# Patient Record
Sex: Female | Born: 1945
Health system: Southern US, Community
[De-identification: ages and names within clinical notes are randomized; demographics above are authoritative.]

## PROBLEM LIST (undated history)

## (undated) DIAGNOSIS — I10 Essential (primary) hypertension: Secondary | ICD-10-CM

## (undated) DIAGNOSIS — K219 Gastro-esophageal reflux disease without esophagitis: Secondary | ICD-10-CM

## (undated) DIAGNOSIS — R011 Cardiac murmur, unspecified: Secondary | ICD-10-CM

## (undated) DIAGNOSIS — M1990 Unspecified osteoarthritis, unspecified site: Secondary | ICD-10-CM

## (undated) DIAGNOSIS — G2581 Restless legs syndrome: Secondary | ICD-10-CM

## (undated) DIAGNOSIS — K5790 Diverticulosis of intestine, part unspecified, without perforation or abscess without bleeding: Secondary | ICD-10-CM

## (undated) DIAGNOSIS — R32 Unspecified urinary incontinence: Secondary | ICD-10-CM

## (undated) DIAGNOSIS — T4145XA Adverse effect of unspecified anesthetic, initial encounter: Secondary | ICD-10-CM

## (undated) HISTORY — PX: TUBAL LIGATION: SHX77

---

## 2004-06-28 DIAGNOSIS — T8859XA Other complications of anesthesia, initial encounter: Secondary | ICD-10-CM

## 2004-06-28 HISTORY — DX: Other complications of anesthesia, initial encounter: T88.59XA

## 2004-06-28 HISTORY — PX: CARDIAC CATHETERIZATION: SHX172

## 2004-10-28 HISTORY — PX: HERNIA REPAIR: SHX51

## 2004-10-28 HISTORY — PX: CHOLECYSTECTOMY: SHX55

## 2004-11-26 HISTORY — PX: OTHER SURGICAL HISTORY: SHX169

## 2011-05-12 HISTORY — PX: OTHER SURGICAL HISTORY: SHX169

## 2012-11-21 ENCOUNTER — Encounter (HOSPITAL_COMMUNITY): Payer: Self-pay | Admitting: Pharmacy Technician

## 2012-11-21 ENCOUNTER — Other Ambulatory Visit: Payer: Self-pay | Admitting: Orthopedic Surgery

## 2012-11-21 MED ORDER — BUPIVACAINE LIPOSOME 1.3 % IJ SUSP: 20.0000 mL | Freq: Once | INTRAMUSCULAR | Status: DC

## 2012-11-21 MED ORDER — DEXAMETHASONE SODIUM PHOSPHATE 10 MG/ML IJ SOLN: 10.0000 mg | Freq: Once | INTRAMUSCULAR | Status: DC

## 2012-11-21 NOTE — Progress Notes (Signed)
Preoperative surgical orders have been place into the Epic hospital system for [facility] on 11/21/2012, 7:47 AM  by Patrica Duel for surgery on 12/04/2012.  Preop Total Knee orders including Experal, IV Tylenol, and IV Decadron as long as there are no contraindications to the above medications. Avel Peace, PA-C .

## 2012-11-27 ENCOUNTER — Other Ambulatory Visit (HOSPITAL_COMMUNITY): Payer: Self-pay | Admitting: Orthopedic Surgery

## 2012-11-27 NOTE — Patient Instructions (Addendum)
20 Calvina Liptak  11/27/2012   Your procedure is scheduled on: 12-04-2012  Report to [facility] at 835 AM.  Call this number if you have problems the morning of surgery [phone]   Remember:   Do not eat food or drink liquids :After Midnight.     Take these medicines the morning of surgery with A SIP OF WATER: xanax if needed, lansprazole                                SEE [place] PREPARING FOR SURGERY SHEET   Do not wear jewelry, make-up or nail polish.  Do not wear lotions, powders, or perfumes. You may wear deodorant.   Men may shave face and neck.  Do not bring valuables to the hospital.  Contacts, dentures or bridgework may not be worn into surgery.  Leave suitcase in the car. After surgery it may be brought to your room.  For patients admitted to the hospital, checkout time is 11:00 AM the day of discharge.   Patients discharged the day of surgery will not be allowed to drive home.  Name and phone number of your driver:  Special Instructions: N/A   Please read over the following fact sheets that you were given: MRSA Information., blood fact sheet, incentive spirometer fact sheet Call Cain Sieve RN pre op nurse if needed [phone]    FAILURE TO FOLLOW THESE INSTRUCTIONS MAY RESULT IN THE CANCELLATION OF YOUR SURGERY. PATIENT SIGNATURE___________________________________________

## 2012-11-28 ENCOUNTER — Other Ambulatory Visit: Payer: Self-pay | Admitting: Orthopedic Surgery

## 2012-11-28 ENCOUNTER — Ambulatory Visit (HOSPITAL_COMMUNITY)
Admission: RE | Admit: 2012-11-28 | Discharge: 2012-11-28 | Disposition: A | Discharge status: Home / Self Care | Payer: Medicare Other | Source: Ambulatory Visit | Attending: Orthopedic Surgery | Admitting: Orthopedic Surgery

## 2012-11-28 ENCOUNTER — Encounter (HOSPITAL_COMMUNITY)
Admission: RE | Admit: 2012-11-28 | Discharge: 2012-11-28 | Disposition: A | Discharge status: Home / Self Care | Payer: Medicare Other | Source: Ambulatory Visit | Attending: Orthopedic Surgery | Admitting: Orthopedic Surgery

## 2012-11-28 ENCOUNTER — Encounter (HOSPITAL_COMMUNITY): Payer: Self-pay

## 2012-11-28 DIAGNOSIS — Z01810 Encounter for preprocedural cardiovascular examination: Secondary | ICD-10-CM | POA: Insufficient documentation

## 2012-11-28 DIAGNOSIS — Z01818 Encounter for other preprocedural examination: Secondary | ICD-10-CM | POA: Insufficient documentation

## 2012-11-28 DIAGNOSIS — I10 Essential (primary) hypertension: Secondary | ICD-10-CM | POA: Insufficient documentation

## 2012-11-28 DIAGNOSIS — Z01812 Encounter for preprocedural laboratory examination: Secondary | ICD-10-CM | POA: Insufficient documentation

## 2012-11-28 HISTORY — DX: Diverticulosis of intestine, part unspecified, without perforation or abscess without bleeding: K57.90

## 2012-11-28 HISTORY — DX: Unspecified osteoarthritis, unspecified site: M19.90

## 2012-11-28 HISTORY — DX: Restless legs syndrome: G25.81

## 2012-11-28 HISTORY — DX: Gastro-esophageal reflux disease without esophagitis: K21.9

## 2012-11-28 HISTORY — DX: Adverse effect of unspecified anesthetic, initial encounter: T41.45XA

## 2012-11-28 HISTORY — DX: Unspecified urinary incontinence: R32

## 2012-11-28 HISTORY — DX: Essential (primary) hypertension: I10

## 2012-11-28 LAB — URINALYSIS, ROUTINE W REFLEX MICROSCOPIC
Glucose, UA: NEGATIVE mg/dL
Leukocytes, UA: NEGATIVE
Protein, ur: NEGATIVE mg/dL
Urobilinogen, UA: 0.2 mg/dL (ref 0.0–1.0)

## 2012-11-28 LAB — CBC
MCH: 32.3 pg (ref 26.0–34.0)
Platelets: 241 10*3/uL (ref 150–400)
RBC: 4.03 MIL/uL (ref 3.87–5.11)
WBC: 9 10*3/uL (ref 4.0–10.5)

## 2012-11-28 LAB — APTT: aPTT: 32 seconds (ref 24–37)

## 2012-11-28 LAB — COMPREHENSIVE METABOLIC PANEL
ALT: 30 U/L (ref 0–35)
AST: 22 U/L (ref 0–37)
CO2: 30 mEq/L (ref 19–32)
Calcium: 9.8 mg/dL (ref 8.4–10.5)
GFR calc non Af Amer: 72 mL/min — ABNORMAL LOW (ref >90)
Potassium: 3.5 mEq/L (ref 3.5–5.1)
Sodium: 141 mEq/L (ref 135–145)

## 2012-11-28 LAB — SURGICAL PCR SCREEN: MRSA, PCR: NEGATIVE

## 2012-11-28 NOTE — Progress Notes (Signed)
11/28/12 1308  OBSTRUCTIVE SLEEP APNEA  Have you ever been diagnosed with sleep apnea through a sleep study? No  Do you snore loudly (loud enough to be heard through closed doors)?  1  Do you often feel tired, fatigued, or sleepy during the daytime? 0  Has anyone observed you stop breathing during your sleep? 0  Do you have, or are you being treated for high blood pressure? 1  BMI more than 35 kg/m2? 1  Age over 66 years old? 1  Neck circumference greater than 40 cm/18 inches? 0  Gender: 0  Obstructive Sleep Apnea Score 4  Score 4 or greater  Results sent to PCP

## 2012-12-03 ENCOUNTER — Other Ambulatory Visit: Payer: Self-pay | Admitting: Orthopedic Surgery

## 2012-12-03 NOTE — H&P (Signed)
Alyssa Campbell  DOB: Jan 08, 1946 Married / Language: English / Race: White Female  Date of Admission:  12/04/2012  Chief Complaint:  Right Knee Pain  History of Present Illness  The patient is a 67 year old female who comes in for a preoperative History and Physical. The patient is scheduled for a right total knee arthroplasty to be performed by Dr. Gus Rankin. Aluisio, MD at [facility] on 12/04/2012. The patient is a 67 year old female who presents with knee complaints. The patient is seen today in referral from three of her neighbors. The patient reports right knee symptoms including: pain which began 1 year(s) (or so) ago. Note for "Knee pain": She states she fell in college and she said the knee "went out of place". She said she had off and on problems initially, but then the knee did not start flaring again until she was doing "push offs" in her water exercise program about a year ago. She has had long term problems with her right knee. She had both hips replaced in [place] and has done well with those. The right knee has gotten progressively worse over the past year or two. She states that it is hurting her at all times. This is limiting what she can and can not do. She has had an injection in the past without much benefit. She is at a stage now where she wants some more permanent solution to this knee. She is ready to proceed with surgery. They have been treated conservatively in the past for the above stated problem and despite conservative measures, they continue to have progressive pain and severe functional limitations and dysfunction. They have failed non-operative management including home exercise, medications, and injections. It is felt that they would benefit from undergoing total joint replacement. Risks and benefits of the procedure have been discussed with the patient and they elect to proceed with surgery. There are no active contraindications to surgery such as  ongoing infection or rapidly progressive neurological disease.   Problem List Primary osteoarthritis of one knee (715.16)   Allergies  Erythromycin *MACROLIDES* Band-Aid *MEDICAL DEVICES*. Adhesive - NOT LATEX ALLERGY   Family History  Cancer. father Chronic Obstructive Lung Disease. mother Osteoarthritis. grandmother mothers side Severe allergy. mother   Social History Marital status. married Most recent primary occupation. Retired Programmer, systems, current Education officer, museum Number of flights of stairs before winded. 1 Living situation. live with spouse Drug/Alcohol Rehab (Currently). no Exercise. Exercises rarely Illicit drug use. no Pain Contract. no Previously in rehab. no Tobacco / smoke exposure. no Tobacco use. never smoker Alcohol use. current drinker; drinks hard liquor; only occasionally per week Children. 0 Current work status. 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 Living Will, Healthcare POA   Medication History Benazepril-Hydrochlorothiazide (10-12.5MG  Tablet, Oral) Active. CeleBREX (200MG  Capsule, Oral) Active. Myrbetriq (50MG  Tablet ER 24HR, Oral) Active. Remeron (15MG  Tablet, Oral) Active. Mirapex (0.25MG  Tablet, Oral) Active. Lansoprazole (30MG  Capsule DR, Oral) Active. Aspirin EC (81MG  Tablet DR, Oral) Active. Xanax (0.5MG  Tablet, Oral) Active. Fish Oil Burp-Less ( Oral) Specific dose unknown - Active. Cranberry ( Oral) Specific dose unknown - Active. Vitamin D3 ( Oral) Specific dose unknown - Active. Vitamin B-12 ( Oral) Specific dose unknown - Active. Multivitamins ( Oral) Active. Calcium Citrate ( Oral) Specific dose unknown - Active. Fiber ( Oral) Active. Probiotic ( Oral) Active.    Past Surgical History  Tubal Ligation Gallbladder Surgery. laporoscopic; Umbilical Hernia Repair Total Hip Replacement. bilateral Cardiac Cath. Date: 2006.  Medical History High blood  pressure Hypercholesterolemia Osteoarthritis Restless Leg Syndrome Diverticulosis Urinary Incontinence Overactive Bladder Gastroesophageal Reflux Disease   Review of Systems  General:Not Present- Chills, Fever, Night Sweats, Appetite Loss, Fatigue, Feeling sick, Weight Gain and Weight Loss. Skin:Not Present- Itching, Rash, Skin Color Changes, Ulcer, Psoriasis and Change in Hair or Nails. HEENT:Not Present- Sensitivity to light, Nose Bleed, Visual Loss, Decreased Hearing and Ringing in the Ears. Neck:Not Present- Swollen Glands and Neck Mass. Respiratory:Not Present- Shortness of breath, Snoring, Chronic Cough and Bloody sputum. Cardiovascular:Not Present- Shortness of Breath, Chest Pain, Swelling of Extremities, Leg Cramps and Palpitations. Gastrointestinal:Present- Heartburn. Not Present- Bloody Stool, Abdominal Pain, Vomiting, Nausea and Incontinence of Stool. Female Genitourinary:Not Present- Blood in Urine, Irregular/missing periods, Frequency, Incontinence and Nocturia. Musculoskeletal:Present- Joint Stiffness and Joint Pain. Not Present- Muscle Weakness, Muscle Pain, Joint Swelling and Back Pain. Neurological:Not Present- Tingling, Numbness, Burning, Tremor, Headaches and Dizziness. Psychiatric:Not Present- Anxiety, Depression and Memory Loss. Endocrine:Not Present- Cold Intolerance, Heat Intolerance, Excessive hunger and Excessive Thirst. Hematology:Not Present- Abnormal Bleeding, Abnormal bruising, Anemia and Blood Clots.   Vitals  Weight: 218 lb Height: 66 in Body Surface Area: 2.15 m Body Mass Index: 35.19 kg/m Pulse: 88 (Regular) Resp.: 16 (Unlabored) BP: 122/76 (Sitting, Right Arm, Standard)    Physical Exam  The physical exam findings are as follows:  Note: Patient is a 67 year old female with continued knee pain. Patient is accompanied today by her husband.   General Mental Status - Alert, cooperative and good historian. General  Appearance- pleasant. Not in acute distress. Orientation- Oriented X3. Build & Nutrition- Well nourished and Well developed.   Head and Neck Head- normocephalic, atraumatic . Neck Global Assessment- supple. no bruit auscultated on the right and no bruit auscultated on the left.   Eye Pupil- Bilateral- Regular and Round. Motion- Bilateral- EOMI.   Chest and Lung Exam Auscultation: Breath sounds:- clear at anterior chest wall and - clear at posterior chest wall. Adventitious sounds:- No Adventitious sounds.   Cardiovascular Auscultation:Rhythm- Regular rate and rhythm. Heart Sounds- S1 WNL and S2 WNL. Murmurs & Other Heart Sounds:Auscultation of the heart reveals - No Murmurs.   Abdomen Palpation/Percussion:Tenderness- Abdomen is non-tender to palpation. Rigidity (guarding)- Abdomen is soft. Auscultation:Auscultation of the abdomen reveals - Bowel sounds normal.   Female Genitourinary  Not done, not pertinent to present illness  Musculoskeletal  The hips show flexion to 110, rotation in 20, out 30 and abduction to 30 without discomfort. The left knee shows no effusion. Range is 0-135 with no tenderness or instability. There is some crepitus on range of motion. Right knee with slight varus. Range is 5-125. There is moderate crepitus on range of motion. Tender medial greater than lateral with no instability noted. Pulses, sensation and motor are intact both lower extremities. Gait pattern is antalgic on the right.  RADIOGRAPHS: AP both knees and lateral show that she has bone on bone arthritis in the medial and patellofemoral compartments of the right knee. Also, she has near bone on bone left knee medial compartment.  Assessment & Plan  Primary osteoarthritis of one knee (715.16) Impression: Right Knee  Note: Plan is for a Right Total Knee Replacement by Dr. Lequita Halt.  Plan is to go [facility].  The patient does not have any contraindications  and will recieve TXA (tranexamic acid) prior to surgery.  PCP - Dr. Yolonda Kida - Patient has been seen preoperatively and felt to be stable for surgery.  Signed electronically by Alexzandrew L  Julien Girt, III PA-C

## 2012-12-04 ENCOUNTER — Inpatient Hospital Stay (HOSPITAL_COMMUNITY)
Admission: RE | Admit: 2012-12-04 | Discharge: 2012-12-07 | DRG: 470 | Disposition: A | Discharge status: Skilled Nursing Facility | Payer: Medicare Other | Source: Ambulatory Visit | Attending: Orthopedic Surgery | Admitting: Orthopedic Surgery

## 2012-12-04 ENCOUNTER — Encounter (HOSPITAL_COMMUNITY): Payer: Self-pay | Admitting: *Deleted

## 2012-12-04 ENCOUNTER — Encounter (HOSPITAL_COMMUNITY): Payer: Self-pay | Admitting: Anesthesiology

## 2012-12-04 ENCOUNTER — Inpatient Hospital Stay (HOSPITAL_COMMUNITY): Payer: Medicare Other | Admitting: Anesthesiology

## 2012-12-04 ENCOUNTER — Encounter (HOSPITAL_COMMUNITY)
Admission: RE | Disposition: A | Discharge status: Skilled Nursing Facility | Payer: Self-pay | Source: Ambulatory Visit | Attending: Orthopedic Surgery

## 2012-12-04 DIAGNOSIS — M179 Osteoarthritis of knee, unspecified: Secondary | ICD-10-CM | POA: Diagnosis present

## 2012-12-04 DIAGNOSIS — G2581 Restless legs syndrome: Secondary | ICD-10-CM | POA: Diagnosis present

## 2012-12-04 DIAGNOSIS — I10 Essential (primary) hypertension: Secondary | ICD-10-CM | POA: Diagnosis present

## 2012-12-04 DIAGNOSIS — Z8719 Personal history of other diseases of the digestive system: Secondary | ICD-10-CM

## 2012-12-04 DIAGNOSIS — K219 Gastro-esophageal reflux disease without esophagitis: Secondary | ICD-10-CM | POA: Diagnosis present

## 2012-12-04 DIAGNOSIS — E7800 Pure hypercholesterolemia, unspecified: Secondary | ICD-10-CM | POA: Diagnosis present

## 2012-12-04 DIAGNOSIS — Z96651 Presence of right artificial knee joint: Secondary | ICD-10-CM

## 2012-12-04 DIAGNOSIS — D62 Acute posthemorrhagic anemia: Secondary | ICD-10-CM | POA: Diagnosis not present

## 2012-12-04 DIAGNOSIS — M1710 Unilateral primary osteoarthritis, unspecified knee: Principal | ICD-10-CM | POA: Diagnosis present

## 2012-12-04 DIAGNOSIS — Z79899 Other long term (current) drug therapy: Secondary | ICD-10-CM

## 2012-12-04 HISTORY — PX: TOTAL KNEE ARTHROPLASTY: SHX125

## 2012-12-04 LAB — TYPE AND SCREEN
ABO/RH(D): O POS
Antibody Screen: NEGATIVE

## 2012-12-04 SURGERY — ARTHROPLASTY, KNEE, TOTAL
Anesthesia: Spinal | Site: Knee | Laterality: Right | Wound class: Clean

## 2012-12-04 MED ORDER — DIPHENHYDRAMINE HCL 12.5 MG/5ML PO ELIX: 12.5000 mg | ORAL_SOLUTION | ORAL | Status: DC | PRN

## 2012-12-04 MED ORDER — MORPHINE SULFATE 2 MG/ML IJ SOLN
1.0000 mg | INTRAMUSCULAR | Status: DC | PRN
  Administered 2012-12-04 – 2012-12-05 (×2): 2 mg via INTRAVENOUS
  Filled 2012-12-04 (×2): qty 1

## 2012-12-04 MED ORDER — DEXAMETHASONE SODIUM PHOSPHATE 10 MG/ML IJ SOLN
10.0000 mg | Freq: Every day | INTRAMUSCULAR | Status: AC
Start: 2012-12-05 — End: 2012-12-05
  Filled 2012-12-04: qty 1

## 2012-12-04 MED ORDER — MEPERIDINE HCL 50 MG/ML IJ SOLN: 6.2500 mg | INTRAMUSCULAR | Status: DC | PRN

## 2012-12-04 MED ORDER — ONDANSETRON HCL 4 MG PO TABS: 4.0000 mg | ORAL_TABLET | Freq: Four times a day (QID) | ORAL | Status: DC | PRN

## 2012-12-04 MED ORDER — ONDANSETRON HCL 4 MG/2ML IJ SOLN: 4.0000 mg | Freq: Four times a day (QID) | INTRAMUSCULAR | Status: DC | PRN

## 2012-12-04 MED ORDER — SODIUM CHLORIDE 0.9 % IV SOLN
INTRAVENOUS | Status: DC
  Administered 2012-12-04 – 2012-12-05 (×2): via INTRAVENOUS

## 2012-12-04 MED ORDER — TRANEXAMIC ACID 100 MG/ML IV SOLN
1000.0000 mg | INTRAVENOUS | Status: AC
  Administered 2012-12-04: 1000 mg via INTRAVENOUS
  Filled 2012-12-04: qty 10

## 2012-12-04 MED ORDER — ACETAMINOPHEN 650 MG RE SUPP: 650.0000 mg | Freq: Four times a day (QID) | RECTAL | Status: DC | PRN

## 2012-12-04 MED ORDER — FLEET ENEMA 7-19 GM/118ML RE ENEM: 1.0000 | ENEMA | Freq: Once | RECTAL | Status: AC | PRN

## 2012-12-04 MED ORDER — METHOCARBAMOL 100 MG/ML IJ SOLN: 500.0000 mg | Freq: Four times a day (QID) | INTRAVENOUS | Status: DC | PRN

## 2012-12-04 MED ORDER — BUPIVACAINE LIPOSOME 1.3 % IJ SUSP
20.0000 mL | Freq: Once | INTRAMUSCULAR | Status: DC
  Filled 2012-12-04: qty 20

## 2012-12-04 MED ORDER — MIDAZOLAM HCL 5 MG/5ML IJ SOLN
INTRAMUSCULAR | Status: DC | PRN
  Administered 2012-12-04: 2 mg via INTRAVENOUS

## 2012-12-04 MED ORDER — SODIUM CHLORIDE 0.9 % IJ SOLN
INTRAMUSCULAR | Status: DC | PRN
  Administered 2012-12-04: 12:00:00

## 2012-12-04 MED ORDER — MIRABEGRON ER 50 MG PO TB24
50.0000 mg | ORAL_TABLET | Freq: Every day | ORAL | Status: DC
  Administered 2012-12-05 – 2012-12-07 (×3): 50 mg via ORAL
  Filled 2012-12-04 (×4): qty 1

## 2012-12-04 MED ORDER — BUPIVACAINE HCL (PF) 0.75 % IJ SOLN
INTRAMUSCULAR | Status: DC | PRN
  Administered 2012-12-04: 13.5 mg

## 2012-12-04 MED ORDER — OXYCODONE HCL 5 MG/5ML PO SOLN
5.0000 mg | Freq: Once | ORAL | Status: DC | PRN
  Filled 2012-12-04: qty 5

## 2012-12-04 MED ORDER — BISACODYL 10 MG RE SUPP: 10.0000 mg | Freq: Every day | RECTAL | Status: DC | PRN

## 2012-12-04 MED ORDER — CALCIUM POLYCARBOPHIL 625 MG PO TABS
1250.0000 mg | ORAL_TABLET | Freq: Every day | ORAL | Status: DC
  Administered 2012-12-05 – 2012-12-07 (×3): 1250 mg via ORAL
  Filled 2012-12-04 (×4): qty 2

## 2012-12-04 MED ORDER — ACETAMINOPHEN 325 MG PO TABS: 650.0000 mg | ORAL_TABLET | Freq: Four times a day (QID) | ORAL | Status: DC | PRN

## 2012-12-04 MED ORDER — PROPOFOL 10 MG/ML IV BOLUS
INTRAVENOUS | Status: DC | PRN
  Administered 2012-12-04 (×2): 20 mg via INTRAVENOUS

## 2012-12-04 MED ORDER — MIRTAZAPINE 15 MG PO TABS
15.0000 mg | ORAL_TABLET | Freq: Every day | ORAL | Status: DC
  Administered 2012-12-04 – 2012-12-06 (×3): 15 mg via ORAL
  Filled 2012-12-04 (×5): qty 1

## 2012-12-04 MED ORDER — POLYETHYLENE GLYCOL 3350 17 G PO PACK
17.0000 g | PACK | Freq: Every day | ORAL | Status: DC | PRN
  Administered 2012-12-06 – 2012-12-07 (×2): 17 g via ORAL

## 2012-12-04 MED ORDER — METOCLOPRAMIDE HCL 5 MG/ML IJ SOLN: 5.0000 mg | Freq: Three times a day (TID) | INTRAMUSCULAR | Status: DC | PRN

## 2012-12-04 MED ORDER — PROMETHAZINE HCL 25 MG/ML IJ SOLN: 6.2500 mg | INTRAMUSCULAR | Status: DC | PRN

## 2012-12-04 MED ORDER — PHENYLEPHRINE HCL 10 MG/ML IJ SOLN
10.0000 mg | INTRAVENOUS | Status: DC | PRN
  Administered 2012-12-04: 40 ug/min via INTRAVENOUS

## 2012-12-04 MED ORDER — BUPIVACAINE HCL 0.25 % IJ SOLN
INTRAMUSCULAR | Status: DC | PRN
  Administered 2012-12-04: 20 mL

## 2012-12-04 MED ORDER — STERILE WATER FOR IRRIGATION IR SOLN
Status: DC | PRN
  Administered 2012-12-04: 3000 mL

## 2012-12-04 MED ORDER — TRAMADOL HCL 50 MG PO TABS: 50.0000 mg | ORAL_TABLET | Freq: Four times a day (QID) | ORAL | Status: DC | PRN

## 2012-12-04 MED ORDER — ACETAMINOPHEN 10 MG/ML IV SOLN: 1000.0000 mg | Freq: Once | INTRAVENOUS | Status: DC

## 2012-12-04 MED ORDER — PANTOPRAZOLE SODIUM 40 MG PO TBEC
40.0000 mg | DELAYED_RELEASE_TABLET | Freq: Every day | ORAL | Status: DC
  Administered 2012-12-05 – 2012-12-07 (×3): 40 mg via ORAL
  Filled 2012-12-04 (×3): qty 1

## 2012-12-04 MED ORDER — HYDROMORPHONE HCL PF 1 MG/ML IJ SOLN: 0.2500 mg | INTRAMUSCULAR | Status: DC | PRN

## 2012-12-04 MED ORDER — METOCLOPRAMIDE HCL 10 MG PO TABS: 5.0000 mg | ORAL_TABLET | Freq: Three times a day (TID) | ORAL | Status: DC | PRN

## 2012-12-04 MED ORDER — OXYCODONE HCL 5 MG PO TABS: 5.0000 mg | ORAL_TABLET | Freq: Once | ORAL | Status: DC | PRN

## 2012-12-04 MED ORDER — SODIUM CHLORIDE 0.9 % IV SOLN: INTRAVENOUS | Status: DC

## 2012-12-04 MED ORDER — LACTATED RINGERS IV SOLN
INTRAVENOUS | Status: DC
  Administered 2012-12-04: 1000 mL via INTRAVENOUS

## 2012-12-04 MED ORDER — SODIUM CHLORIDE 0.9 % IR SOLN
Status: DC | PRN
  Administered 2012-12-04: 1000 mL

## 2012-12-04 MED ORDER — PHENOL 1.4 % MT LIQD: 1.0000 | OROMUCOSAL | Status: DC | PRN

## 2012-12-04 MED ORDER — PROPOFOL INFUSION 10 MG/ML OPTIME
INTRAVENOUS | Status: DC | PRN
  Administered 2012-12-04: 75 ug/kg/min via INTRAVENOUS

## 2012-12-04 MED ORDER — PRAMIPEXOLE DIHYDROCHLORIDE 0.25 MG PO TABS
0.2500 mg | ORAL_TABLET | Freq: Every day | ORAL | Status: DC
  Administered 2012-12-04 – 2012-12-05 (×2): 0.25 mg via ORAL
  Administered 2012-12-06: 0.5 mg via ORAL
  Filled 2012-12-04 (×4): qty 2

## 2012-12-04 MED ORDER — PHENYLEPHRINE HCL 10 MG/ML IJ SOLN
INTRAMUSCULAR | Status: DC | PRN
  Administered 2012-12-04 (×2): 40 ug via INTRAVENOUS

## 2012-12-04 MED ORDER — CEFAZOLIN SODIUM 1-5 GM-% IV SOLN
1.0000 g | Freq: Four times a day (QID) | INTRAVENOUS | Status: AC
  Administered 2012-12-04 (×2): 1 g via INTRAVENOUS
  Filled 2012-12-04 (×2): qty 50

## 2012-12-04 MED ORDER — FENTANYL CITRATE 0.05 MG/ML IJ SOLN
INTRAMUSCULAR | Status: DC | PRN
  Administered 2012-12-04: 50 ug via INTRAVENOUS

## 2012-12-04 MED ORDER — OXYCODONE HCL 5 MG PO TABS
5.0000 mg | ORAL_TABLET | ORAL | Status: DC | PRN
  Administered 2012-12-04 – 2012-12-05 (×7): 10 mg via ORAL
  Administered 2012-12-06 (×3): 5 mg via ORAL
  Administered 2012-12-06 (×2): 10 mg via ORAL
  Administered 2012-12-07 (×2): 5 mg via ORAL
  Filled 2012-12-04 (×2): qty 1
  Filled 2012-12-04: qty 2
  Filled 2012-12-04 (×2): qty 1
  Filled 2012-12-04 (×7): qty 2
  Filled 2012-12-04: qty 1
  Filled 2012-12-04: qty 2
  Filled 2012-12-04: qty 1

## 2012-12-04 MED ORDER — MENTHOL 3 MG MT LOZG: 1.0000 | LOZENGE | OROMUCOSAL | Status: DC | PRN

## 2012-12-04 MED ORDER — ATORVASTATIN CALCIUM 40 MG PO TABS
40.0000 mg | ORAL_TABLET | Freq: Every day | ORAL | Status: DC
  Administered 2012-12-05 – 2012-12-06 (×2): 40 mg via ORAL
  Filled 2012-12-04 (×4): qty 1

## 2012-12-04 MED ORDER — DEXAMETHASONE 6 MG PO TABS
10.0000 mg | ORAL_TABLET | Freq: Every day | ORAL | Status: AC
  Administered 2012-12-05: 10 mg via ORAL
  Filled 2012-12-04: qty 1

## 2012-12-04 MED ORDER — ALPRAZOLAM 0.5 MG PO TABS: 0.5000 mg | ORAL_TABLET | Freq: Every evening | ORAL | Status: DC | PRN

## 2012-12-04 MED ORDER — CEFAZOLIN SODIUM-DEXTROSE 2-3 GM-% IV SOLR
2.0000 g | INTRAVENOUS | Status: AC
  Administered 2012-12-04: 2 g via INTRAVENOUS

## 2012-12-04 MED ORDER — DOCUSATE SODIUM 100 MG PO CAPS
100.0000 mg | ORAL_CAPSULE | Freq: Two times a day (BID) | ORAL | Status: DC
  Administered 2012-12-04 – 2012-12-07 (×6): 100 mg via ORAL

## 2012-12-04 MED ORDER — METHOCARBAMOL 500 MG PO TABS
500.0000 mg | ORAL_TABLET | Freq: Four times a day (QID) | ORAL | Status: DC | PRN
  Administered 2012-12-04 – 2012-12-07 (×7): 500 mg via ORAL
  Filled 2012-12-04 (×8): qty 1

## 2012-12-04 MED ORDER — 0.9 % SODIUM CHLORIDE (POUR BTL) OPTIME
TOPICAL | Status: DC | PRN
  Administered 2012-12-04: 1000 mL

## 2012-12-04 MED ORDER — KETOROLAC TROMETHAMINE 15 MG/ML IJ SOLN: 15.0000 mg | Freq: Four times a day (QID) | INTRAMUSCULAR | Status: DC | PRN

## 2012-12-04 MED ORDER — RIVAROXABAN 10 MG PO TABS
10.0000 mg | ORAL_TABLET | Freq: Every day | ORAL | Status: DC
  Administered 2012-12-05 – 2012-12-07 (×3): 10 mg via ORAL
  Filled 2012-12-04 (×4): qty 1

## 2012-12-04 MED ORDER — ACETAMINOPHEN 10 MG/ML IV SOLN: 1000.0000 mg | Freq: Once | INTRAVENOUS | Status: DC | PRN

## 2012-12-04 SURGICAL SUPPLY — 56 items
BAG ZIPLOCK 12X15 (MISCELLANEOUS) ×2 IMPLANT
BANDAGE ELASTIC 6 VELCRO ST LF (GAUZE/BANDAGES/DRESSINGS) ×2 IMPLANT
BANDAGE ESMARK 6X9 LF (GAUZE/BANDAGES/DRESSINGS) ×1 IMPLANT
BLADE SAG 18X100X1.27 (BLADE) ×2 IMPLANT
BLADE SAW SGTL 11.0X1.19X90.0M (BLADE) ×2 IMPLANT
BNDG ESMARK 6X9 LF (GAUZE/BANDAGES/DRESSINGS) ×2
BOWL SMART MIX CTS (DISPOSABLE) ×2 IMPLANT
CAPT RP KNEE ×2 IMPLANT
CEMENT HV SMART SET (Cement) ×4 IMPLANT
CLOTH BEACON ORANGE TIMEOUT ST (SAFETY) ×2 IMPLANT
CUFF TOURN SGL QUICK 34 (TOURNIQUET CUFF) ×1
CUFF TRNQT CYL 34X4X40X1 (TOURNIQUET CUFF) ×1 IMPLANT
DECANTER SPIKE VIAL GLASS SM (MISCELLANEOUS) ×2 IMPLANT
DRAPE EXTREMITY T 121X128X90 (DRAPE) ×2 IMPLANT
DRAPE POUCH INSTRU U-SHP 10X18 (DRAPES) ×2 IMPLANT
DRAPE U-SHAPE 47X51 STRL (DRAPES) ×2 IMPLANT
DRSG ADAPTIC 3X8 NADH LF (GAUZE/BANDAGES/DRESSINGS) ×2 IMPLANT
DRSG PAD ABDOMINAL 8X10 ST (GAUZE/BANDAGES/DRESSINGS) ×2 IMPLANT
DURAPREP 26ML APPLICATOR (WOUND CARE) ×2 IMPLANT
ELECT REM PT RETURN 9FT ADLT (ELECTROSURGICAL) ×2
ELECTRODE REM PT RTRN 9FT ADLT (ELECTROSURGICAL) ×1 IMPLANT
EVACUATOR 1/8 PVC DRAIN (DRAIN) ×2 IMPLANT
FACESHIELD LNG OPTICON STERILE (SAFETY) ×10 IMPLANT
GLOVE BIO SURGEON STRL SZ7.5 (GLOVE) IMPLANT
GLOVE BIO SURGEON STRL SZ8 (GLOVE) ×2 IMPLANT
GLOVE BIOGEL PI IND STRL 8 (GLOVE) ×1 IMPLANT
GLOVE BIOGEL PI INDICATOR 8 (GLOVE) ×1
GLOVE SURG SS PI 6.5 STRL IVOR (GLOVE) IMPLANT
GOWN STRL NON-REIN LRG LVL3 (GOWN DISPOSABLE) ×2 IMPLANT
GOWN STRL REIN XL XLG (GOWN DISPOSABLE) IMPLANT
HANDPIECE INTERPULSE COAX TIP (DISPOSABLE) ×1
IMMOBILIZER KNEE 20 (SOFTGOODS) ×2
IMMOBILIZER KNEE 20 THIGH 36 (SOFTGOODS) ×1 IMPLANT
KIT BASIN OR (CUSTOM PROCEDURE TRAY) ×2 IMPLANT
MANIFOLD NEPTUNE II (INSTRUMENTS) ×2 IMPLANT
NDL SAFETY ECLIPSE 18X1.5 (NEEDLE) ×2 IMPLANT
NEEDLE HYPO 18GX1.5 SHARP (NEEDLE) ×2
NS IRRIG 1000ML POUR BTL (IV SOLUTION) ×2 IMPLANT
PACK TOTAL JOINT (CUSTOM PROCEDURE TRAY) ×2 IMPLANT
PAD ABD 7.5X8 STRL (GAUZE/BANDAGES/DRESSINGS) ×2 IMPLANT
PADDING CAST COTTON 6X4 STRL (CAST SUPPLIES) ×2 IMPLANT
POSITIONER SURGICAL ARM (MISCELLANEOUS) ×2 IMPLANT
SET HNDPC FAN SPRY TIP SCT (DISPOSABLE) ×1 IMPLANT
SPONGE GAUZE 4X4 12PLY (GAUZE/BANDAGES/DRESSINGS) ×2 IMPLANT
STRIP CLOSURE SKIN 1/2X4 (GAUZE/BANDAGES/DRESSINGS) ×2 IMPLANT
SUCTION FRAZIER 12FR DISP (SUCTIONS) ×2 IMPLANT
SUT MNCRL AB 4-0 PS2 18 (SUTURE) ×2 IMPLANT
SUT VIC AB 2-0 CT1 27 (SUTURE) ×3
SUT VIC AB 2-0 CT1 TAPERPNT 27 (SUTURE) ×3 IMPLANT
SUT VLOC 180 0 24IN GS25 (SUTURE) ×2 IMPLANT
SYR 20CC LL (SYRINGE) ×2 IMPLANT
SYR 50ML LL SCALE MARK (SYRINGE) ×2 IMPLANT
TOWEL OR 17X26 10 PK STRL BLUE (TOWEL DISPOSABLE) ×4 IMPLANT
TRAY FOLEY CATH 14FRSI W/METER (CATHETERS) ×2 IMPLANT
WATER STERILE IRR 1500ML POUR (IV SOLUTION) ×4 IMPLANT
WRAP KNEE MAXI GEL POST OP (GAUZE/BANDAGES/DRESSINGS) ×2 IMPLANT

## 2012-12-04 NOTE — Progress Notes (Signed)
UR COMPLETED  

## 2012-12-04 NOTE — Op Note (Signed)
Pre-operative diagnosis- Osteoarthritis  Right knee(s)  Post-operative diagnosis- Osteoarthritis Right knee(s)  Procedure-  Right  Total Knee Arthroplasty  Surgeon- Gus Rankin. Tannah Dreyfuss, MD  Assistant- Dimitri Ped, PA-C   Anesthesia-  Spinal EBL-* No blood loss amount entered *  Drains Hemovac  Tourniquet time-  Total Tourniquet Time Documented: Thigh (Right) - 33 minutes Total: Thigh (Right) - 33 minutes    Complications- None  Condition-PACU - hemodynamically stable.   Brief Clinical Note  Alyssa Campbell is a 67 y.o. year old female with end stage OA of her right knee with progressively worsening pain and dysfunction. She has constant pain, with activity and at rest and significant functional deficits with difficulties even with ADLs. She has had extensive non-op management including analgesics, injections of cortisone and viscosupplements, and home exercise program, but remains in significant pain with significant dysfunction.Radiographs show bone on bone arthritis medial and patellofemoral. She presents now for right Total Knee Arthroplasty.    Procedure in detail---   The patient is brought into the operating room and positioned supine on the operating table. After successful administration of  Spinal,   a tourniquet is placed high on the  Right thigh(s) and the lower extremity is prepped and draped in the usual sterile fashion. Time out is performed by the operating team and then the  Right lower extremity is wrapped in Esmarch, knee flexed and the tourniquet inflated to 300 mmHg.       A midline incision is made with a ten blade through the subcutaneous tissue to the level of the extensor mechanism. A fresh blade is used to make a medial parapatellar arthrotomy. Soft tissue over the proximal medial tibia is subperiosteally elevated to the joint line with a knife and into the semimembranosus bursa with a Cobb elevator. Soft tissue over the proximal lateral tibia is elevated with  attention being paid to avoiding the patellar tendon on the tibial tubercle. The patella is everted, knee flexed 90 degrees and the ACL and PCL are removed. Findings are bone on bone medial and patellofemoral with large global osteophytes.        The drill is used to create a starting hole in the distal femur and the canal is thoroughly irrigated with sterile saline to remove the fatty contents. The 5 degree Right  valgus alignment guide is placed into the femoral canal and the distal femoral cutting block is pinned to remove 10 mm off the distal femur. Resection is made with an oscillating saw.      The tibia is subluxed forward and the menisci are removed. The extramedullary alignment guide is placed referencing proximally at the medial aspect of the tibial tubercle and distally along the second metatarsal axis and tibial crest. The block is pinned to remove 2mm off the more deficient medial  side. Resection is made with an oscillating saw. Size 3is the most appropriate size for the tibia and the proximal tibia is prepared with the modular drill and keel punch for that size.      The femoral sizing guide is placed and size 3 is most appropriate. Rotation is marked off the epicondylar axis and confirmed by creating a rectangular flexion gap at 90 degrees. The size 3 cutting block is pinned in this rotation and the anterior, posterior and chamfer cuts are made with the oscillating saw. The intercondylar block is then placed and that cut is made.      Trial size 3 tibial component, trial size 3 posterior stabilized  femur and a 10  mm posterior stabilized rotating platform insert trial is placed. Full extension is achieved with excellent varus/valgus and anterior/posterior balance throughout full range of motion. The patella is everted and thickness measured to be 22  mm. Free hand resection is taken to 12 mm, a 38 template is placed, lug holes are drilled, trial patella is placed, and it tracks normally.  Osteophytes are removed off the posterior femur with the trial in place. All trials are removed and the cut bone surfaces prepared with pulsatile lavage. Cement is mixed and once ready for implantation, the size 3 tibial implant, size  3 posterior stabilized femoral component, and the size 38 patella are cemented in place and the patella is held with the clamp. The trial insert is placed and the knee held in full extension. The Exparel (20 ml mixed with 30 ml saline) and .25% Bupivicaine, are injected into the extensor mechanism, posterior capsule, medial and lateral gutters and subcutaneous tissues.  All extruded cement is removed and once the cement is hard the permanent 10 mm posterior stabilized rotating platform insert is placed into the tibial tray.      The wound is copiously irrigated with saline solution and the extensor mechanism closed over a hemovac drain with #1 PDS suture. The tourniquet is released for a total tourniquet time of 32  minutes. Flexion against gravity is 135 degrees and the patella tracks normally. Subcutaneous tissue is closed with 2.0 vicryl and subcuticular with running 4.0 Monocryl. The incision is cleaned and dried and steri-strips and a bulky sterile dressing are applied. The limb is placed into a knee immobilizer and the patient is awakened and transported to recovery in stable condition.      Please note that a surgical assistant was a medical necessity for this procedure in order to perform it in a safe and expeditious manner. Surgical assistant was necessary to retract the ligaments and vital neurovascular structures to prevent injury to them and also necessary for proper positioning of the limb to allow for anatomic placement of the prosthesis.   Gus Rankin Tristian Bouska, MD    12/04/2012, 12:40 PM

## 2012-12-04 NOTE — Interval H&P Note (Signed)
History and Physical Interval Note:  12/04/2012 9:29 AM  Alyssa Campbell  has presented today for surgery, with the diagnosis of Osteoarthritis of the right knee  The various methods of treatment have been discussed with the patient and family. After consideration of risks, benefits and other options for treatment, the patient has consented to  Procedure(s): RIGHT TOTAL KNEE ARTHROPLASTY (Right) as a surgical intervention .  The patient's history has been reviewed, patient examined, no change in status, stable for surgery.  I have reviewed the patient's chart and labs.  Questions were answered to the patient's satisfaction.     Loanne Drilling

## 2012-12-04 NOTE — Transfer of Care (Signed)
Immediate Anesthesia Transfer of Care Note  Patient: Alyssa Campbell  Procedure(s) Performed: Procedure(s) (LRB): RIGHT TOTAL KNEE ARTHROPLASTY (Right)  Patient Location: PACU  Anesthesia Type: Spinal  Level of Consciousness: sedated, patient cooperative and responds to stimulaton  Airway & Oxygen Therapy: Patient Spontanous Breathing and Patient connected to face mask oxgen  Post-op Assessment: Report given to PACU RN and Post -op Vital signs reviewed and stable  Post vital signs: Reviewed and stable T-12 level on exam.   Complications: No apparent anesthesia complications

## 2012-12-04 NOTE — Anesthesia Procedure Notes (Signed)
Spinal  Patient location during procedure: OR Start time: 12/04/2012 11:37 AM End time: 12/04/2012 11:44 AM Staffing CRNA/Resident: Paris Lore Performed by: resident/CRNA  Preanesthetic Checklist Completed: patient identified, site marked, surgical consent, pre-op evaluation, timeout performed, IV checked, risks and benefits discussed and monitors and equipment checked Spinal Block Patient position: sitting Prep: Betadine Patient monitoring: heart rate, continuous pulse ox and blood pressure Approach: right paramedian Location: L2-3 Injection technique: single-shot Needle Needle type: Spinocan  Needle gauge: 22 G Needle length: 9 cm Needle insertion depth: 5 cm Assessment Sensory level: T4 Additional Notes Expiration date of kit checked and confirmed. Patient tolerated procedure well, without complications. X 1 attempts with noted CSF return easy withdrawal and administration of medication. Noted T4 level on exam.

## 2012-12-04 NOTE — H&P (View-Only) (Signed)
Alyssa Campbell  DOB: 08/12/1945 Married / Language: English / Race: White Female  Date of Admission:  12/04/2012  Chief Complaint:  Right Knee Pain  History of Present Illness  The patient is a 66 year old female who comes in for a preoperative History and Physical. The patient is scheduled for a right total knee arthroplasty to be performed by Dr. Frank V. Aluisio, MD at [facility] on 12/04/2012. The patient is a 66 year old female who presents with knee complaints. The patient is seen today in referral from three of her neighbors. The patient reports right knee symptoms including: pain which began 1 year(s) (or so) ago. Note for "Knee pain": She states she fell in college and she said the knee "went out of place". She said she had off and on problems initially, but then the knee did not start flaring again until she was doing "push offs" in her water exercise program about a year ago. She has had long term problems with her right knee. She had both hips replaced in [place] and has done well with those. The right knee has gotten progressively worse over the past year or two. She states that it is hurting her at all times. This is limiting what she can and can not do. She has had an injection in the past without much benefit. She is at a stage now where she wants some more permanent solution to this knee. She is ready to proceed with surgery. They have been treated conservatively in the past for the above stated problem and despite conservative measures, they continue to have progressive pain and severe functional limitations and dysfunction. They have failed non-operative management including home exercise, medications, and injections. It is felt that they would benefit from undergoing total joint replacement. Risks and benefits of the procedure have been discussed with the patient and they elect to proceed with surgery. There are no active contraindications to surgery such as  ongoing infection or rapidly progressive neurological disease.   Problem List Primary osteoarthritis of one knee (715.16)   Allergies  Erythromycin *MACROLIDES* Band-Aid *MEDICAL DEVICES*. Adhesive - NOT LATEX ALLERGY   Family History  Cancer. father Chronic Obstructive Lung Disease. mother Osteoarthritis. grandmother mothers side Severe allergy. mother   Social History Marital status. married Most recent primary occupation. Retired educator, current glass artist Number of flights of stairs before winded. 1 Living situation. live with spouse Drug/Alcohol Rehab (Currently). no Exercise. Exercises rarely Illicit drug use. no Pain Contract. no Previously in rehab. no Tobacco / smoke exposure. no Tobacco use. never smoker Alcohol use. current drinker; drinks hard liquor; only occasionally per week Children. 0 Current work status. retired Post-Surgical Plans. [facility] Advance Directives. Living Will, Healthcare POA   Medication History Benazepril-Hydrochlorothiazide (10-12.5MG Tablet, Oral) Active. CeleBREX (200MG Capsule, Oral) Active. Myrbetriq (50MG Tablet ER 24HR, Oral) Active. Remeron (15MG Tablet, Oral) Active. Mirapex (0.25MG Tablet, Oral) Active. Lansoprazole (30MG Capsule DR, Oral) Active. Aspirin EC (81MG Tablet DR, Oral) Active. Xanax (0.5MG Tablet, Oral) Active. Fish Oil Burp-Less ( Oral) Specific dose unknown - Active. Cranberry ( Oral) Specific dose unknown - Active. Vitamin D3 ( Oral) Specific dose unknown - Active. Vitamin B-12 ( Oral) Specific dose unknown - Active. Multivitamins ( Oral) Active. Calcium Citrate ( Oral) Specific dose unknown - Active. Fiber ( Oral) Active. Probiotic ( Oral) Active.    Past Surgical History  Tubal Ligation Gallbladder Surgery. laporoscopic; Umbilical Hernia Repair Total Hip Replacement. bilateral Cardiac Cath. Date: 2006.     Medical History High blood  pressure Hypercholesterolemia Osteoarthritis Restless Leg Syndrome Diverticulosis Urinary Incontinence Overactive Bladder Gastroesophageal Reflux Disease   Review of Systems  General:Not Present- Chills, Fever, Night Sweats, Appetite Loss, Fatigue, Feeling sick, Weight Gain and Weight Loss. Skin:Not Present- Itching, Rash, Skin Color Changes, Ulcer, Psoriasis and Change in Hair or Nails. HEENT:Not Present- Sensitivity to light, Nose Bleed, Visual Loss, Decreased Hearing and Ringing in the Ears. Neck:Not Present- Swollen Glands and Neck Mass. Respiratory:Not Present- Shortness of breath, Snoring, Chronic Cough and Bloody sputum. Cardiovascular:Not Present- Shortness of Breath, Chest Pain, Swelling of Extremities, Leg Cramps and Palpitations. Gastrointestinal:Present- Heartburn. Not Present- Bloody Stool, Abdominal Pain, Vomiting, Nausea and Incontinence of Stool. Female Genitourinary:Not Present- Blood in Urine, Irregular/missing periods, Frequency, Incontinence and Nocturia. Musculoskeletal:Present- Joint Stiffness and Joint Pain. Not Present- Muscle Weakness, Muscle Pain, Joint Swelling and Back Pain. Neurological:Not Present- Tingling, Numbness, Burning, Tremor, Headaches and Dizziness. Psychiatric:Not Present- Anxiety, Depression and Memory Loss. Endocrine:Not Present- Cold Intolerance, Heat Intolerance, Excessive hunger and Excessive Thirst. Hematology:Not Present- Abnormal Bleeding, Abnormal bruising, Anemia and Blood Clots.   Vitals  Weight: 218 lb Height: 66 in Body Surface Area: 2.15 m Body Mass Index: 35.19 kg/m Pulse: 88 (Regular) Resp.: 16 (Unlabored) BP: 122/76 (Sitting, Right Arm, Standard)    Physical Exam  The physical exam findings are as follows:  Note: Patient is a 66 year old female with continued knee pain. Patient is accompanied today by her husband.   General Mental Status - Alert, cooperative and good historian. General  Appearance- pleasant. Not in acute distress. Orientation- Oriented X3. Build & Nutrition- Well nourished and Well developed.   Head and Neck Head- normocephalic, atraumatic . Neck Global Assessment- supple. no bruit auscultated on the right and no bruit auscultated on the left.   Eye Pupil- Bilateral- Regular and Round. Motion- Bilateral- EOMI.   Chest and Lung Exam Auscultation: Breath sounds:- clear at anterior chest wall and - clear at posterior chest wall. Adventitious sounds:- No Adventitious sounds.   Cardiovascular Auscultation:Rhythm- Regular rate and rhythm. Heart Sounds- S1 WNL and S2 WNL. Murmurs & Other Heart Sounds:Auscultation of the heart reveals - No Murmurs.   Abdomen Palpation/Percussion:Tenderness- Abdomen is non-tender to palpation. Rigidity (guarding)- Abdomen is soft. Auscultation:Auscultation of the abdomen reveals - Bowel sounds normal.   Female Genitourinary  Not done, not pertinent to present illness  Musculoskeletal  The hips show flexion to 110, rotation in 20, out 30 and abduction to 30 without discomfort. The left knee shows no effusion. Range is 0-135 with no tenderness or instability. There is some crepitus on range of motion. Right knee with slight varus. Range is 5-125. There is moderate crepitus on range of motion. Tender medial greater than lateral with no instability noted. Pulses, sensation and motor are intact both lower extremities. Gait pattern is antalgic on the right.  RADIOGRAPHS: AP both knees and lateral show that she has bone on bone arthritis in the medial and patellofemoral compartments of the right knee. Also, she has near bone on bone left knee medial compartment.  Assessment & Plan  Primary osteoarthritis of one knee (715.16) Impression: Right Knee  Note: Plan is for a Right Total Knee Replacement by Dr. Aluisio.  Plan is to go [facility].  The patient does not have any contraindications  and will recieve TXA (tranexamic acid) prior to surgery.  PCP - Dr. Steven Lewis - Patient has been seen preoperatively and felt to be stable for surgery.  Signed electronically by Alexzandrew L   Perkins, III PA-C  

## 2012-12-04 NOTE — Anesthesia Postprocedure Evaluation (Signed)
Anesthesia Post Note  Patient: Public affairs consultant  Procedure(s) Performed: Procedure(s) (LRB): RIGHT TOTAL KNEE ARTHROPLASTY (Right)  Anesthesia type: Spinal  Patient location: PACU  Post pain: Pain level controlled  Post assessment: Post-op Vital signs reviewed  Last Vitals: BP 140/83  Pulse 97  Temp(Src) 36.9 C (Oral)  Resp 16  SpO2 100%  Post vital signs: Reviewed  Level of consciousness: sedated  Complications: No apparent anesthesia complications

## 2012-12-04 NOTE — Anesthesia Preprocedure Evaluation (Addendum)
Anesthesia Evaluation  Patient identified by MRN, date of birth, ID band Patient awake    Reviewed: Allergy & Precautions, H&P , NPO status , Patient's Chart, lab work & pertinent test results  History of Anesthesia Complications Negative for: history of anesthetic complications  Airway Mallampati: I TM Distance: >3 FB Neck ROM: Full    Dental  (+) Dental Advisory Given and Teeth Intact   Pulmonary neg pulmonary ROS,  breath sounds clear to auscultation        Cardiovascular hypertension, Pt. on medications Rhythm:Regular Rate:Normal     Neuro/Psych negative neurological ROS  negative psych ROS   GI/Hepatic Neg liver ROS, GERD-  Medicated,  Endo/Other  negative endocrine ROS  Renal/GU negative Renal ROS     Musculoskeletal negative musculoskeletal ROS (+)   Abdominal (+) + obese,   Peds  Hematology negative hematology ROS (+)   Anesthesia Other Findings   Reproductive/Obstetrics negative OB ROS                          Anesthesia Physical Anesthesia Plan  ASA: II  Anesthesia Plan: Spinal   Post-op Pain Management:    Induction: Intravenous  Airway Management Planned: Simple Face Mask  Additional Equipment:   Intra-op Plan:   Post-operative Plan:   Informed Consent: I have reviewed the patients History and Physical, chart, labs and discussed the procedure including the risks, benefits and alternatives for the proposed anesthesia with the patient or authorized representative who has indicated his/her understanding and acceptance.   Dental advisory given  Plan Discussed with: CRNA  Anesthesia Plan Comments:        Anesthesia Quick Evaluation

## 2012-12-05 ENCOUNTER — Encounter (HOSPITAL_COMMUNITY): Payer: Self-pay | Admitting: Orthopedic Surgery

## 2012-12-05 DIAGNOSIS — D62 Acute posthemorrhagic anemia: Secondary | ICD-10-CM

## 2012-12-05 LAB — BASIC METABOLIC PANEL
BUN: 11 mg/dL (ref 6–23)
CO2: 31 mEq/L (ref 19–32)
Calcium: 8.4 mg/dL (ref 8.4–10.5)
Glucose, Bld: 131 mg/dL — ABNORMAL HIGH (ref 70–99)
Potassium: 3.7 mEq/L (ref 3.5–5.1)
Sodium: 139 mEq/L (ref 135–145)

## 2012-12-05 LAB — CBC
HCT: 32 % — ABNORMAL LOW (ref 36.0–46.0)
Hemoglobin: 10.4 g/dL — ABNORMAL LOW (ref 12.0–15.0)
MCH: 31.2 pg (ref 26.0–34.0)
MCHC: 32.5 g/dL (ref 30.0–36.0)
RBC: 3.33 MIL/uL — ABNORMAL LOW (ref 3.87–5.11)

## 2012-12-05 NOTE — Progress Notes (Signed)
Clinical Social Work Department BRIEF PSYCHOSOCIAL ASSESSMENT 12/05/2012  Patient:  Alyssa Campbell, Alyssa Campbell     Account Number:  [id number]     Admit date:  12/04/2012  Clinical Social Worker:  Candie Chroman  Date/Time:  12/05/2012 08:33 AM  Referred by:  Physician  Date Referred:  12/05/2012 Referred for  SNF Placement   Other Referral:   Interview type:  Patient Other interview type:    PSYCHOSOCIAL DATA Living Status:  HUSBAND Admitted from facility:   Level of care:   Primary support name:  Gretta Arab Primary support relationship to patient:  SPOUSE Degree of support available:   supportive    CURRENT CONCERNS Current Concerns  Post-Acute Placement   Other Concerns:    SOCIAL WORK ASSESSMENT / PLAN Pt is a 67 yr old female living at home prior to hospitalization. CSW met with pt to assist with d/c planning . Pt has made prior arrangements to have ST Rehab at [facility] following hospital d/c. CSW has contacted SNF and d/c plan has been confirmed. CSW will follow to assist with d/c planning to SNF.   Assessment/plan status:  Psychosocial Support/Ongoing Assessment of Needs Other assessment/ plan:   Information/referral to community resources:   Insurance coverage for SNF & Ambulance transport reviewed.    PATIENT'S/FAMILY'S RESPONSE TO PLAN OF CARE: Pt is looking forward to having rehab at [facility] .   Cori Razor LCSW [phone]

## 2012-12-05 NOTE — Progress Notes (Signed)
   Subjective: 1 Day Post-Op Procedure(s) (LRB): RIGHT TOTAL KNEE ARTHROPLASTY (Right) Patient reports pain as moderate last night but better this morning. Patient seen in rounds with Dr. Lequita Halt. Patient is well, and has had no acute complaints or problems We will start therapy today.  Plan is to go [facility] [facility] [employer] after hospital stay.  Objective: Vital signs in last 24 hours: Temp:  [97.3 F (36.3 C)-99 F (37.2 C)] 98.4 F (36.9 C) (06/10 0536) Pulse Rate:  [65-97] 93 (06/10 0536) Resp:  [12-21] 16 (06/10 0536) BP: (99-141)/(66-84) 105/66 mmHg (06/10 0536) SpO2:  [94 %-100 %] 94 % (06/10 0536) Weight:  [98.884 kg (218 lb)] 98.884 kg (218 lb) (06/09 1445)  Intake/Output from previous day:  Intake/Output Summary (Last 24 hours) at 12/05/12 0741 Last data filed at 12/05/12 0700  Gross per 24 hour  Intake   4520 ml  Output   2754 ml  Net   1766 ml    Intake/Output this shift:    Labs:  Recent Labs  12/05/12 0425  HGB 10.4*    Recent Labs  12/05/12 0425  WBC 9.6  RBC 3.33*  HCT 32.0*  PLT 182    Recent Labs  12/05/12 0425  NA 139  K 3.7  CL 102  CO2 31  BUN 11  CREATININE 0.81  GLUCOSE 131*  CALCIUM 8.4   No results found for this basename: LABPT, INR,  in the last 72 hours  EXAM General - Patient is Alert, Appropriate and Oriented Extremity - Neurovascular intact Sensation intact distally Dorsiflexion/Plantar flexion intact Dressing - dressing C/D/I Motor Function - intact, moving foot and toes well on exam.  Hemovac pulled without difficulty.  Past Medical History  Diagnosis Date  . Hypertension   . GERD (gastroesophageal reflux disease)   . Diverticulosis     no recent flares  . Restless leg syndrome   . Urinary incontinence     controlled with meds  . Arthritis   . Complication of anesthesia 2006    bp dropped  after left hip replacment, had general    Assessment/Plan: 1 Day Post-Op Procedure(s) (LRB): RIGHT TOTAL KNEE  ARTHROPLASTY (Right) Principal Problem:   OA (osteoarthritis) of knee Active Problems:   Postoperative anemia due to acute blood loss  Estimated body mass index is 35.2 kg/(m^2) as calculated from the following:   Height as of this encounter: 5\' 6"  (1.676 m).   Weight as of this encounter: 98.884 kg (218 lb). Advance diet Up with therapy Discharge to SNF  DVT Prophylaxis - Xarelto Weight-Bearing as tolerated to right leg No vaccines. D/C O2 and Pulse OX and try on Room [address]  Patrica Duel 12/05/2012, 7:41 AM

## 2012-12-05 NOTE — Evaluation (Signed)
Physical Therapy Evaluation Patient Details Name: Alyssa Campbell MRN: [id number] DOB: 1945-07-22 Today's Date: 12/05/2012 Time: 5621-3086 PT Time Calculation (min): 23 min  PT Assessment / Plan / Recommendation Clinical Impression  s/p R TKA and will benefit from PT to improve functional mobility and safety; Plan is for rehab post acute at [facility]    PT Assessment  Patient needs continued PT services    Follow Up Recommendations  SNF    Does the patient have the potential to tolerate intense rehabilitation      Barriers to Discharge        Equipment Recommendations  None recommended by PT    Recommendations for Other Services     Frequency 7X/week    Precautions / Restrictions Precautions Precautions: Knee Required Braces or Orthoses: Knee Immobilizer - Right Knee Immobilizer - Right: Discontinue once straight leg raise with < 10 degree lag Restrictions RLE Weight Bearing: Weight bearing as tolerated   Pertinent Vitals/Pain 3/10      Mobility  Bed Mobility Bed Mobility: Supine to Sit Supine to Sit: 4: Min assist;HOB elevated Details for Bed Mobility Assistance: cues for technique Transfers Transfers: Sit to Stand;Stand to Sit Sit to Stand: 4: Min assist Stand to Sit: 4: Min assist Details for Transfer Assistance: verbal cues for hand placement and RLE management Ambulation/Gait Ambulation/Gait Assistance: 4: Min assist Ambulation Distance (Feet): 54 Feet Assistive device: Rolling walker Ambulation/Gait Assistance Details: cues for RW position Gait Pattern: Step-to pattern;Antalgic    Exercises Total Joint Exercises Ankle Circles/Pumps: AROM;10 reps;Both Quad Sets: AROM;Both;10 reps Heel Slides: AAROM;Right;10 reps   PT Diagnosis: Difficulty walking  PT Problem List: Decreased strength;Decreased range of motion;Decreased knowledge of use of DME;Decreased mobility PT Treatment Interventions: DME instruction;Gait training;Functional mobility  training;Therapeutic activities;Therapeutic exercise;Balance training;Patient/family education   PT Goals Acute Rehab PT Goals PT Goal Formulation: With patient Time For Goal Achievement: 12/12/12 Potential to Achieve Goals: Good Pt will go Supine/Side to Sit: with supervision PT Goal: Supine/Side to Sit - Progress: Goal set today Pt will go Sit to Stand: with supervision PT Goal: Sit to Stand - Progress: Goal set today Pt will Ambulate: 51 - 150 feet;with supervision;with rolling walker PT Goal: Ambulate - Progress: Goal set today Pt will Perform Home Exercise Program: with supervision, verbal cues required/provided PT Goal: Perform Home Exercise Program - Progress: Goal set today  Visit Information  Last PT Received On: 12/05/12 Assistance Needed: +1    Subjective Data  Patient Stated Goal: rehab   Prior Functioning  Home Living Available Help at Discharge: Skilled Nursing Facility Prior Function Level of Independence: Independent    Cognition  Cognition Arousal/Alertness: Awake/alert Behavior During Therapy: WFL for tasks assessed/performed Overall Cognitive Status: Within Functional Limits for tasks assessed    Extremity/Trunk Assessment Right Lower Extremity Assessment RLE ROM/Strength/Tone: Deficits;Unable to fully assess;Due to pain RLE ROM/Strength/Tone Deficits: ankle WFL; assists with SLR Left Lower Extremity Assessment LLE ROM/Strength/Tone: [facility] for tasks assessed   Balance    End of Session PT - End of Session Equipment Utilized During Treatment: Gait belt;Right knee immobilizer Activity Tolerance: Patient tolerated treatment well Patient left: in chair;with call bell/phone within reach;with family/visitor present  GP     [facility] 12/05/2012, 11:07 AM

## 2012-12-05 NOTE — Progress Notes (Signed)
12/05/12 1300  PT Visit Information  Last PT Received On 12/05/12  Assistance Needed +1  PT Time Calculation  PT Start Time 1321  PT Stop Time 1350  PT Time Calculation (min) 29 min  Precautions  Precautions Knee  Required Braces or Orthoses Knee Immobilizer - Right  Knee Immobilizer - Right Discontinue once straight leg raise with < 10 degree lag  Restrictions  RLE Weight Bearing WBAT  Cognition  Arousal/Alertness Awake/alert  Behavior During Therapy WFL for tasks assessed/performed  Overall Cognitive Status Within Functional Limits for tasks assessed  Bed Mobility  Bed Mobility Sit to Supine  Sit to Supine 4: Min assist  Details for Bed Mobility Assistance cues for technique  Transfers  Transfers Sit to Stand;Stand to Sit  Sit to Stand 4: Min guard  Stand to Sit 4: Min guard  Details for Transfer Assistance verbal cues for hand placement and RLE management  Ambulation/Gait  Ambulation/Gait Assistance 4: Min assist;4: Min guard  Ambulation Distance (Feet) 110 Feet  Assistive device Rolling walker  Ambulation/Gait Assistance Details cues for posture and shoulder depression  Gait Pattern Step-to pattern;Step-through pattern  Total Joint Exercises  Ankle Circles/Pumps AROM;10 reps;Both  Quad Sets AROM;Both;10 reps  Heel Slides AAROM;Right;10 reps  Straight Leg Raises AAROM;Right;10 reps  Goniometric ROM 52*  PT - End of Session  Equipment Utilized During Treatment Gait belt;Right knee immobilizer  Activity Tolerance Patient tolerated treatment well  Patient left in bed;with call bell/phone within reach;with family/visitor present  PT - Assessment/Plan  Comments on Treatment Session progressing well  PT Plan Discharge plan remains appropriate;Frequency remains appropriate  PT Frequency 7X/week  Follow Up Recommendations SNF  PT equipment None recommended by PT  Acute Rehab PT Goals  Time For Goal Achievement 12/12/12  Potential to Achieve Goals Good  Pt will go Sit  to Stand with supervision  PT Goal: Sit to Stand - Progress Progressing toward goal  Pt will Ambulate 51 - 150 feet;with supervision;with rolling walker  PT Goal: Ambulate - Progress Goal set today  Pt will Perform Home Exercise Program with supervision, verbal cues required/provided  PT Goal: Perform Home Exercise Program - Progress Progressing toward goal  PT General Charges  $$ ACUTE PT VISIT 1 Procedure  PT Treatments  $Gait Training 8-22 mins  $Therapeutic Exercise 8-22 mins

## 2012-12-05 NOTE — Progress Notes (Signed)
Clinical Social Work Department CLINICAL SOCIAL WORK PLACEMENT NOTE 12/05/2012  Patient:  Alyssa Campbell, Alyssa Campbell  Account Number:  [id number] Admit date:  12/04/2012  Clinical Social Worker:  Cori Razor, LCSW  Date/time:  12/05/2012 08:43 AM  Clinical Social Work is seeking post-discharge placement for this patient at the following level of care:   SKILLED NURSING   (*CSW will update this form in Epic as items are completed)     Patient/family provided with [facility] Department of Clinical Social Work's list of facilities offering this level of care within the geographic area requested by the patient (or if unable, by the patient's family).  12/05/2012  Patient/family informed of their freedom to choose among providers that offer the needed level of care, that participate in Medicare, Medicaid or managed care program needed by the patient, have an available bed and are willing to accept the patient.    Patient/family informed of [facility]' ownership interest in [facility], as well as of the fact that they are under no obligation to receive care at this facility.  PASARR submitted to EDS on 12/05/2012 PASARR number received from EDS on   FL2 transmitted to all facilities in geographic area requested by pt/family on  12/05/2012 FL2 transmitted to all facilities within larger geographic area on   Patient informed that his/her managed care company has contracts with or will negotiate with  certain facilities, including the following:     Patient/family informed of bed offers received:  12/05/2012 Patient chooses bed at [facility] Physician recommends and patient chooses bed at    Patient to be transferred to [facility] on   Patient to be transferred to facility by   The following physician request were entered in Epic:   Additional Comments:  Cori Razor LCSW [phone]

## 2012-12-06 LAB — BASIC METABOLIC PANEL
CO2: 28 mEq/L (ref 19–32)
Calcium: 9.3 mg/dL (ref 8.4–10.5)
Creatinine, Ser: 0.75 mg/dL (ref 0.50–1.10)
GFR calc non Af Amer: 87 mL/min — ABNORMAL LOW (ref >90)
Glucose, Bld: 184 mg/dL — ABNORMAL HIGH (ref 70–99)

## 2012-12-06 LAB — CBC
Hemoglobin: 10.8 g/dL — ABNORMAL LOW (ref 12.0–15.0)
MCH: 31.4 pg (ref 26.0–34.0)
MCHC: 33.1 g/dL (ref 30.0–36.0)
MCV: 94.8 fL (ref 78.0–100.0)
RBC: 3.44 MIL/uL — ABNORMAL LOW (ref 3.87–5.11)

## 2012-12-06 NOTE — Progress Notes (Signed)
OT Cancellation Note  Patient Details Name: Racquelle Hyser MRN: [id number] DOB: 02/28/1946   Cancelled Treatment:    Reason Eval/Treat Not Completed: Other (comment) (defer OT eval to SNF)  Lennox Laity [phone] 12/06/2012, 12:25 PM

## 2012-12-06 NOTE — Progress Notes (Signed)
   Subjective: 2 Days Post-Op Procedure(s) (LRB): RIGHT TOTAL KNEE ARTHROPLASTY (Right) Patient reports pain as mild.   Plan is to go Skilled nursing facility after hospital stay.  Objective: Vital signs in last 24 hours: Temp:  [98.3 F (36.8 C)-99.2 F (37.3 C)] 99.2 F (37.3 C) (06/11 0455) Pulse Rate:  [95-111] 95 (06/11 0455) Resp:  [16] 16 (06/11 0455) BP: (118-147)/(72-83) 147/83 mmHg (06/11 0455) SpO2:  [90 %-94 %] 90 % (06/11 0455)  Intake/Output from previous day:  Intake/Output Summary (Last 24 hours) at 12/06/12 0808 Last data filed at 12/06/12 0456  Gross per 24 hour  Intake    840 ml  Output   2250 ml  Net  -1410 ml    Intake/Output this shift:    Labs:  Recent Labs  12/05/12 0425 12/06/12 0451  HGB 10.4* 10.8*    Recent Labs  12/05/12 0425 12/06/12 0451  WBC 9.6 18.5*  RBC 3.33* 3.44*  HCT 32.0* 32.6*  PLT 182 224    Recent Labs  12/05/12 0425 12/06/12 0451  NA 139 141  K 3.7 3.5  CL 102 103  CO2 31 28  BUN 11 12  CREATININE 0.81 0.75  GLUCOSE 131* 184*  CALCIUM 8.4 9.3   No results found for this basename: LABPT, INR,  in the last 72 hours  EXAM General - Patient is Alert, Appropriate and Oriented Extremity - Neurologically intact Neurovascular intact Incision: dressing C/D/I No cellulitis present Compartment soft Dressing/Incision - clean, dry, no drainage Motor Function - intact, moving foot and toes well on exam.   Past Medical History  Diagnosis Date  . Hypertension   . GERD (gastroesophageal reflux disease)   . Diverticulosis     no recent flares  . Restless leg syndrome   . Urinary incontinence     controlled with meds  . Arthritis   . Complication of anesthesia 2006    bp dropped  after left hip replacment, had general    Assessment/Plan: 2 Days Post-Op Procedure(s) (LRB): RIGHT TOTAL KNEE ARTHROPLASTY (Right) Principal Problem:   OA (osteoarthritis) of knee Active Problems:   Postoperative anemia due  to acute blood loss   Advance diet Up with therapy Plan for discharge tomorrow to SNF  DVT Prophylaxis - Xarelto Weight-Bearing as tolerated to right leg  Farida Mcreynolds V 12/06/2012, 8:08 AM

## 2012-12-06 NOTE — Care Management Note (Signed)
    Page 1 of 1   12/06/2012     2:29:50 PM   CARE MANAGEMENT NOTE 12/06/2012  Patient:  Alyssa Campbell, Alyssa Campbell   Account Number:  [id number]  Date Initiated:  12/06/2012  Documentation initiated by:  Colleen Can  Subjective/Objective Assessment:   dx OA rt knee; total knee replacemnt     Action/Plan:   SNF rehab   Anticipated DC Date:  12/07/2012   Anticipated DC Plan:  SKILLED NURSING FACILITY  In-house referral  Clinical Social Worker      DC Planning Services  CM consult      Choice offered to / List presented to:             Status of service:  Completed, signed off Medicare Important Message given?  NA - LOS <3 / Initial given by admissions (If response is "NO", the following Medicare IM given date fields will be blank) Date Medicare IM given:   Date Additional Medicare IM given:    Discharge Disposition:    Per UR Regulation:    If discussed at Long Length of Stay Meetings, dates discussed:    Comments:

## 2012-12-06 NOTE — Progress Notes (Signed)
Physical Therapy Treatment Patient Details Name: Alyssa Campbell MRN: [id number] DOB: 03/27/1946 Today's Date: 12/06/2012 Time: 1914-7829 PT Time Calculation (min): 20 min  PT Assessment / Plan / Recommendation Comments on Treatment Session  progressing well    Follow Up Recommendations  SNF     Does the patient have the potential to tolerate intense rehabilitation     Barriers to Discharge        Equipment Recommendations  None recommended by PT    Recommendations for Other Services    Frequency 7X/week   Plan Discharge plan remains appropriate;Frequency remains appropriate    Precautions / Restrictions Precautions Precautions: Knee Required Braces or Orthoses: Knee Immobilizer - Right Knee Immobilizer - Right: Discontinue once straight leg raise with < 10 degree lag Restrictions Weight Bearing Restrictions: No RLE Weight Bearing: Weight bearing as tolerated   Pertinent Vitals/Pain 2-3/10    Mobility  Bed Mobility Bed Mobility: Supine to Sit Sit to Supine: 4: Min guard Details for Bed Mobility Assistance: Pt self assisting L LE with R LE Transfers Transfers: Sit to Stand;Stand to Sit Sit to Stand: 4: Min guard Stand to Sit: 4: Min guard Details for Transfer Assistance: verbal cues for hand placement and RLE management Ambulation/Gait Ambulation/Gait Assistance: 4: Min assist;4: Min guard Ambulation Distance (Feet): 100 Feet (twice) Assistive device: Rolling walker Ambulation/Gait Assistance Details: cues for posture and position from RW Gait Pattern: Step-to pattern;Step-through pattern General Gait Details: progressing to recip gait Stairs: Yes Stairs Assistance: 4: Min assist Stairs Assistance Details (indicate cue type and reason): cues for sequence and foot/RW placement Stair Management Technique: No rails;Backwards;With walker Number of Stairs: 1 (twice)    Exercises Total Joint Exercises Ankle Circles/Pumps: AROM;Both;20 reps Quad Sets: AROM;Both;20  reps Heel Slides: AAROM;Right;20 reps Straight Leg Raises: AAROM;Right;20 reps;Supine   PT Diagnosis:    PT Problem List:   PT Treatment Interventions:     PT Goals Acute Rehab PT Goals PT Goal Formulation: With patient Time For Goal Achievement: 12/12/12 Potential to Achieve Goals: Good Pt will go Supine/Side to Sit: with supervision PT Goal: Supine/Side to Sit - Progress: Progressing toward goal Pt will go Sit to Stand: with supervision PT Goal: Sit to Stand - Progress: Progressing toward goal Pt will Ambulate: 51 - 150 feet;with supervision;with rolling walker PT Goal: Ambulate - Progress: Progressing toward goal Pt will Perform Home Exercise Program: with supervision, verbal cues required/provided PT Goal: Perform Home Exercise Program - Progress: Progressing toward goal  Visit Information  Last PT Received On: 12/06/12 Assistance Needed: +1    Subjective Data  Subjective: I'm doing ok (I'm doing ok) Patient Stated Goal: rehab   Cognition  Cognition Arousal/Alertness: Awake/alert Behavior During Therapy: WFL for tasks assessed/performed Overall Cognitive Status: Within Functional Limits for tasks assessed    Balance     End of Session PT - End of Session Equipment Utilized During Treatment: Gait belt;Right knee immobilizer Activity Tolerance: Patient tolerated treatment well Patient left: in bed;with call bell/phone within reach;with family/visitor present Nurse Communication: Mobility status   GP     Alyssa Campbell 12/06/2012, 2:10 PM

## 2012-12-06 NOTE — Progress Notes (Signed)
Physical Therapy Treatment Patient Details Name: Alyssa Campbell MRN: [id number] DOB: [birth year] Today's Date: 12/06/2012 Time: 0347-4259 PT Time Calculation (min): 29 min  PT Assessment / Plan / Recommendation Comments on Treatment Session  progressing well    Follow Up Recommendations  SNF     Does the patient have the potential to tolerate intense rehabilitation     Barriers to Discharge        Equipment Recommendations  None recommended by PT    Recommendations for Other Services    Frequency 7X/week   Plan Discharge plan remains appropriate;Frequency remains appropriate    Precautions / Restrictions Precautions Precautions: Knee Required Braces or Orthoses: Knee Immobilizer - Right Knee Immobilizer - Right: Discontinue once straight leg raise with < 10 degree lag Restrictions Weight Bearing Restrictions: No RLE Weight Bearing: Weight bearing as tolerated   Pertinent Vitals/Pain 3-4/10; premed, cold packs provided    Mobility  Transfers Transfers: Sit to Stand;Stand to Sit Sit to Stand: 4: Min guard Stand to Sit: 4: Min guard Details for Transfer Assistance: verbal cues for hand placement and RLE management Ambulation/Gait Ambulation/Gait Assistance: 4: Min assist;4: Min guard Ambulation Distance (Feet): 75 Feet (twice) Assistive device: Rolling walker Ambulation/Gait Assistance Details: cues for sequence, posture and position from RW Gait Pattern: Step-to pattern;Step-through pattern    Exercises Total Joint Exercises Ankle Circles/Pumps: AROM;Both;20 reps Quad Sets: AROM;Both;20 reps Heel Slides: AAROM;Right;20 reps Straight Leg Raises: AAROM;Right;20 reps;Supine   PT Diagnosis:    PT Problem List:   PT Treatment Interventions:     PT Goals Acute Rehab PT Goals PT Goal Formulation: With patient Time For Goal Achievement: 12/12/12 Potential to Achieve Goals: Good Pt will go Supine/Side to Sit: with supervision PT Goal: Supine/Side to Sit - Progress:  Progressing toward goal Pt will go Sit to Stand: with supervision PT Goal: Sit to Stand - Progress: Progressing toward goal Pt will Ambulate: 51 - 150 feet;with supervision;with rolling walker PT Goal: Ambulate - Progress: Progressing toward goal Pt will Perform Home Exercise Program: with supervision, verbal cues required/provided PT Goal: Perform Home Exercise Program - Progress: Progressing toward goal  Visit Information  Last PT Received On: 12/06/12 Assistance Needed: +1    Subjective Data  Patient Stated Goal: rehab   Cognition  Cognition Arousal/Alertness: Awake/alert Behavior During Therapy: WFL for tasks assessed/performed Overall Cognitive Status: Within Functional Limits for tasks assessed    Balance     End of Session PT - End of Session Equipment Utilized During Treatment: Gait belt;Right knee immobilizer Activity Tolerance: Patient tolerated treatment well Patient left: in chair;with call bell/phone within reach;with family/visitor present Nurse Communication: Mobility status   GP     Alyssa Campbell 12/06/2012, 12:39 PM

## 2012-12-07 ENCOUNTER — Other Ambulatory Visit: Payer: Self-pay | Admitting: *Deleted

## 2012-12-07 LAB — CBC
MCH: 30.2 pg (ref 26.0–34.0)
MCHC: 31.7 g/dL (ref 30.0–36.0)
MCV: 95.2 fL (ref 78.0–100.0)
Platelets: 201 10*3/uL (ref 150–400)
RDW: 14.4 % (ref 11.5–15.5)

## 2012-12-07 MED ORDER — OXYCODONE HCL 5 MG PO TABS: ORAL_TABLET | ORAL | Status: AC

## 2012-12-07 MED ORDER — ALPRAZOLAM 0.5 MG PO TABS: ORAL_TABLET | ORAL | Status: AC

## 2012-12-07 MED ORDER — BISACODYL 10 MG RE SUPP: 10.0000 mg | Freq: Every day | RECTAL | Status: DC | PRN

## 2012-12-07 MED ORDER — RIVAROXABAN 10 MG PO TABS: 10.0000 mg | ORAL_TABLET | Freq: Every day | ORAL | Status: DC

## 2012-12-07 MED ORDER — METHOCARBAMOL 500 MG PO TABS: 500.0000 mg | ORAL_TABLET | Freq: Four times a day (QID) | ORAL | Status: DC | PRN

## 2012-12-07 MED ORDER — OXYCODONE HCL 5 MG PO TABS: 5.0000 mg | ORAL_TABLET | ORAL | Status: DC | PRN

## 2012-12-07 MED ORDER — POLYETHYLENE GLYCOL 3350 17 G PO PACK: 17.0000 g | PACK | Freq: Every day | ORAL | Status: DC | PRN

## 2012-12-07 MED ORDER — DSS 100 MG PO CAPS: 100.0000 mg | ORAL_CAPSULE | Freq: Two times a day (BID) | ORAL | Status: DC

## 2012-12-07 MED ORDER — DIPHENHYDRAMINE HCL 12.5 MG/5ML PO ELIX: 12.5000 mg | ORAL_SOLUTION | ORAL | Status: DC | PRN

## 2012-12-07 MED ORDER — ONDANSETRON HCL 4 MG PO TABS: 4.0000 mg | ORAL_TABLET | Freq: Four times a day (QID) | ORAL | Status: DC | PRN

## 2012-12-07 MED ORDER — ACETAMINOPHEN 325 MG PO TABS: 650.0000 mg | ORAL_TABLET | Freq: Four times a day (QID) | ORAL | Status: DC | PRN

## 2012-12-07 MED ORDER — BISACODYL 10 MG RE SUPP
10.0000 mg | Freq: Once | RECTAL | Status: AC
  Administered 2012-12-07: 10 mg via RECTAL
  Filled 2012-12-07: qty 1

## 2012-12-07 MED ORDER — METOCLOPRAMIDE HCL 5 MG PO TABS: 5.0000 mg | ORAL_TABLET | Freq: Three times a day (TID) | ORAL | Status: DC | PRN

## 2012-12-07 MED ORDER — TRAMADOL HCL 50 MG PO TABS: 50.0000 mg | ORAL_TABLET | Freq: Four times a day (QID) | ORAL | Status: DC | PRN

## 2012-12-07 NOTE — Progress Notes (Signed)
   Subjective: 3 Days Post-Op Procedure(s) (LRB): RIGHT TOTAL KNEE ARTHROPLASTY (Right) Patient reports pain as mild.   Patient seen in rounds with Dr. Lequita Halt. Patient is well, and has had no acute complaints or problems Patient is ready to go to [place].  Objective: Vital signs in last 24 hours: Temp:  [98.3 F (36.8 C)-98.5 F (36.9 C)] 98.5 F (36.9 C) (06/12 0525) Pulse Rate:  [85-92] 85 (06/12 0525) Resp:  [16] 16 (06/12 0525) BP: (115-133)/(75-81) 127/80 mmHg (06/12 0525) SpO2:  [90 %-94 %] 93 % (06/12 0525)  Intake/Output from previous day:  Intake/Output Summary (Last 24 hours) at 12/07/12 0701 Last data filed at 12/06/12 2250  Gross per 24 hour  Intake    720 ml  Output   1200 ml  Net   -480 ml    Intake/Output this shift:    Labs:  Recent Labs  12/05/12 0425 12/06/12 0451 12/07/12 0454  HGB 10.4* 10.8* 9.5*    Recent Labs  12/06/12 0451 12/07/12 0454  WBC 18.5* 12.7*  RBC 3.44* 3.15*  HCT 32.6* 30.0*  PLT 224 201    Recent Labs  12/05/12 0425 12/06/12 0451  NA 139 141  K 3.7 3.5  CL 102 103  CO2 31 28  BUN 11 12  CREATININE 0.81 0.75  GLUCOSE 131* 184*  CALCIUM 8.4 9.3   No results found for this basename: LABPT, INR,  in the last 72 hours  EXAM: General - Patient is Alert, Appropriate and Oriented Extremity - Neurovascular intact Sensation intact distally Dorsiflexion/Plantar flexion intact No cellulitis present Incision - clean, dry, no drainage, healing Motor Function - intact, moving foot and toes well on exam.   Assessment/Plan: 3 Days Post-Op Procedure(s) (LRB): RIGHT TOTAL KNEE ARTHROPLASTY (Right) Procedure(s) (LRB): RIGHT TOTAL KNEE ARTHROPLASTY (Right) Past Medical History  Diagnosis Date  . Hypertension   . GERD (gastroesophageal reflux disease)   . Diverticulosis     no recent flares  . Restless leg syndrome   . Urinary incontinence     controlled with meds  . Arthritis   . Complication of anesthesia 2006     bp dropped  after left hip replacment, had general   Principal Problem:   OA (osteoarthritis) of knee Active Problems:   Postoperative anemia due to acute blood loss  Estimated body mass index is 35.2 kg/(m^2) as calculated from the following:   Height as of this encounter: 5\' 6"  (1.676 m).   Weight as of this encounter: 98.884 kg (218 lb). Up with therapy Discharge to SNF Diet - Cardiac diet Follow up - in 2 weeks Activity - WBAT Disposition - Skilled nursing facility Condition Upon Discharge - Good D/C Meds - See DC Summary DVT Prophylaxis - Xarelto  Sharea Guinther 12/07/2012, 7:01 AM

## 2012-12-07 NOTE — Progress Notes (Signed)
Clinical Social Work Department CLINICAL SOCIAL WORK PLACEMENT NOTE 12/07/2012  Patient:  Alyssa Campbell, Alyssa Campbell  Account Number:  [id number] Admit date:  12/04/2012  Clinical Social Worker:  Cori Razor, LCSW  Date/time:  12/05/2012 08:43 AM  Clinical Social Work is seeking post-discharge placement for this patient at the following level of care:   SKILLED NURSING   (*CSW will update this form in Epic as items are completed)     Patient/family provided with [facility] Department of Clinical Social Work's list of facilities offering this level of care within the geographic area requested by the patient (or if unable, by the patient's family).  12/05/2012  Patient/family informed of their freedom to choose among providers that offer the needed level of care, that participate in Medicare, Medicaid or managed care program needed by the patient, have an available bed and are willing to accept the patient.    Patient/family informed of [facility]' ownership interest in [facility], as well as of the fact that they are under no obligation to receive care at this facility.  PASARR submitted to [employer] on 12/05/2012 PASARR number received from [employer] on 12/07/2012  FL2 transmitted to all facilities in geographic area requested by pt/family on  12/05/2012 FL2 transmitted to all facilities within larger geographic area on   Patient informed that his/her managed care company has contracts with or will negotiate with  certain facilities, including the following:     Patient/family informed of bed offers received:  12/05/2012 Patient chooses bed at [facility] Physician recommends and patient chooses bed at    Patient to be transferred to [facility] on  12/07/2012 Patient to be transferred to facility by [facility]  The following physician request were entered in Epic:   Additional Comments:  Cori Razor LCSW [phone]

## 2012-12-07 NOTE — Progress Notes (Signed)
Report called to [facility].  ?

## 2012-12-07 NOTE — Discharge Summary (Signed)
Physician Discharge Summary   Patient ID: Alyssa Campbell MRN: [id number] DOB/AGE: 1946-06-08 67 y.o.  Admit date: 12/04/2012 Discharge date: 12/07/2012  Primary Diagnosis:  Osteoarthritis Right knee  Admission Diagnoses:  Past Medical History  Diagnosis Date  . Hypertension   . GERD (gastroesophageal reflux disease)   . Diverticulosis     no recent flares  . Restless leg syndrome   . Urinary incontinence     controlled with meds  . Arthritis   . Complication of anesthesia 2006    bp dropped  after left hip replacment, had general   Discharge Diagnoses:   Principal Problem:   OA (osteoarthritis) of knee Active Problems:   Postoperative anemia due to acute blood loss  Estimated body mass index is 35.2 kg/(m^2) as calculated from the following:   Height as of this encounter: 5\' 6"  (1.676 m).   Weight as of this encounter: 98.884 kg (218 lb).  Procedure:  Procedure(s) (LRB): RIGHT TOTAL KNEE ARTHROPLASTY (Right)   Consults: None  HPI: Alyssa Campbell is a 67 y.o. year old female with end stage OA of her right knee with progressively worsening pain and dysfunction. She has constant pain, with activity and at rest and significant functional deficits with difficulties even with ADLs. She has had extensive non-op management including analgesics, injections of cortisone and viscosupplements, and home exercise program, but remains in significant pain with significant dysfunction.Radiographs show bone on bone arthritis medial and patellofemoral. She presents now for right Total Knee Arthroplasty.   Laboratory Data: Admission on 12/04/2012  Component Date Value Range Status  . WBC 12/05/2012 9.6  4.0 - 10.5 K/uL Final  . RBC 12/05/2012 3.33* 3.87 - 5.11 MIL/uL Final  . Hemoglobin 12/05/2012 10.4* 12.0 - 15.0 g/dL Final  . HCT 40/98/1191 32.0* 36.0 - 46.0 % Final  . MCV 12/05/2012 96.1  78.0 - 100.0 fL Final  . MCH 12/05/2012 31.2  26.0 - 34.0 pg Final  . MCHC 12/05/2012 32.5  30.0 -  36.0 g/dL Final  . RDW 47/82/9562 14.2  11.5 - 15.5 % Final  . Platelets 12/05/2012 182  150 - 400 K/uL Final  . Sodium 12/05/2012 139  135 - 145 mEq/L Final  . Potassium 12/05/2012 3.7  3.5 - 5.1 mEq/L Final  . Chloride 12/05/2012 102  96 - 112 mEq/L Final  . CO2 12/05/2012 31  19 - 32 mEq/L Final  . Glucose, Bld 12/05/2012 131* 70 - 99 mg/dL Final  . BUN 13/01/6577 11  6 - 23 mg/dL Final  . Creatinine, Ser 12/05/2012 0.81  0.50 - 1.10 mg/dL Final  . Calcium 46/96/2952 8.4  8.4 - 10.5 mg/dL Final  . GFR calc non Af Amer 12/05/2012 75* >90 mL/min Final  . GFR calc Af Amer 12/05/2012 86* >90 mL/min Final   Comment:                                 The eGFR has been calculated                          using the CKD EPI equation.                          This calculation has not been  validated in all clinical                          situations.                          eGFR's persistently                          <90 mL/min signify                          possible Chronic Kidney Disease.  . WBC 12/06/2012 18.5* 4.0 - 10.5 K/uL Final  . RBC 12/06/2012 3.44* 3.87 - 5.11 MIL/uL Final  . Hemoglobin 12/06/2012 10.8* 12.0 - 15.0 g/dL Final  . HCT 16/03/9603 32.6* 36.0 - 46.0 % Final  . MCV 12/06/2012 94.8  78.0 - 100.0 fL Final  . MCH 12/06/2012 31.4  26.0 - 34.0 pg Final  . MCHC 12/06/2012 33.1  30.0 - 36.0 g/dL Final  . RDW 54/02/8118 14.0  11.5 - 15.5 % Final  . Platelets 12/06/2012 224  150 - 400 K/uL Final  . Sodium 12/06/2012 141  135 - 145 mEq/L Final  . Potassium 12/06/2012 3.5  3.5 - 5.1 mEq/L Final  . Chloride 12/06/2012 103  96 - 112 mEq/L Final  . CO2 12/06/2012 28  19 - 32 mEq/L Final  . Glucose, Bld 12/06/2012 184* 70 - 99 mg/dL Final  . BUN 14/78/2956 12  6 - 23 mg/dL Final  . Creatinine, Ser 12/06/2012 0.75  0.50 - 1.10 mg/dL Final  . Calcium 21/30/8657 9.3  8.4 - 10.5 mg/dL Final  . GFR calc non Af Amer 12/06/2012 87* >90 mL/min Final  . GFR  calc Af Amer 12/06/2012 >90  >90 mL/min Final   Comment:                                 The eGFR has been calculated                          using the CKD EPI equation.                          This calculation has not been                          validated in all clinical                          situations.                          eGFR's persistently                          <90 mL/min signify                          possible Chronic Kidney Disease.  . WBC 12/07/2012 12.7* 4.0 - 10.5 K/uL Final  . RBC 12/07/2012 3.15* 3.87 - 5.11 MIL/uL Final  . Hemoglobin 12/07/2012 9.5* 12.0 - 15.0 g/dL Final  . HCT 84/69/6295 30.0* 36.0 - 46.0 %  Final  . MCV 12/07/2012 95.2  78.0 - 100.0 fL Final  . MCH 12/07/2012 30.2  26.0 - 34.0 pg Final  . MCHC 12/07/2012 31.7  30.0 - 36.0 g/dL Final  . RDW 16/03/9603 14.4  11.5 - 15.5 % Final  . Platelets 12/07/2012 201  150 - 400 K/uL Final  Hospital Outpatient Visit on 11/28/2012  Component Date Value Range Status  . ABO/RH(D) 11/28/2012 O POS   Final  Hospital Outpatient Visit on 11/28/2012  Component Date Value Range Status  . aPTT 11/28/2012 32  24 - 37 seconds Final  . WBC 11/28/2012 9.0  4.0 - 10.5 K/uL Final  . RBC 11/28/2012 4.03  3.87 - 5.11 MIL/uL Final  . Hemoglobin 11/28/2012 13.0  12.0 - 15.0 g/dL Final  . HCT 54/02/8118 49.8* 36.0 - 46.0 % Final  . MCV 11/28/2012 123.6* 78.0 - 100.0 fL Final  . MCH 11/28/2012 32.3  26.0 - 34.0 pg Final  . MCHC 11/28/2012 26.1* 30.0 - 36.0 g/dL Final  . RDW 14/78/2956 14.7  11.5 - 15.5 % Final  . Platelets 11/28/2012 241  150 - 400 K/uL Final  . Sodium 11/28/2012 141  135 - 145 mEq/L Final  . Potassium 11/28/2012 3.5  3.5 - 5.1 mEq/L Final  . Chloride 11/28/2012 102  96 - 112 mEq/L Final  . CO2 11/28/2012 30  19 - 32 mEq/L Final  . Glucose, Bld 11/28/2012 113* 70 - 99 mg/dL Final  . BUN 21/30/8657 17  6 - 23 mg/dL Final  . Creatinine, Ser 11/28/2012 0.83  0.50 - 1.10 mg/dL Final  . Calcium  84/69/6295 9.8  8.4 - 10.5 mg/dL Final  . Total Protein 11/28/2012 7.4  6.0 - 8.3 g/dL Final  . Albumin 28/41/3244 3.8  3.5 - 5.2 g/dL Final  . AST 06/30/7251 22  0 - 37 U/L Final  . ALT 11/28/2012 30  0 - 35 U/L Final  . Alkaline Phosphatase 11/28/2012 71  39 - 117 U/L Final  . Total Bilirubin 11/28/2012 0.4  0.3 - 1.2 mg/dL Final  . GFR calc non Af Amer 11/28/2012 72* >90 mL/min Final  . GFR calc Af Amer 11/28/2012 84* >90 mL/min Final   Comment:                                 The eGFR has been calculated                          using the CKD EPI equation.                          This calculation has not been                          validated in all clinical                          situations.                          eGFR's persistently                          <90 mL/min signify  possible Chronic Kidney Disease.  Marland Kitchen Prothrombin Time 11/28/2012 12.6  11.6 - 15.2 seconds Final  . INR 11/28/2012 0.95  0.00 - 1.49 Final  . ABO/RH(D) 11/28/2012 O POS   Final  . Antibody Screen 11/28/2012 NEG   Final  . Sample Expiration 11/28/2012 12/07/2012   Final  . Color, Urine 11/28/2012 YELLOW  YELLOW Final  . APPearance 11/28/2012 CLEAR  CLEAR Final  . Specific Gravity, Urine 11/28/2012 1.024  1.005 - 1.030 Final  . pH 11/28/2012 5.0  5.0 - 8.0 Final  . Glucose, UA 11/28/2012 NEGATIVE  NEGATIVE mg/dL Final  . Hgb urine dipstick 11/28/2012 NEGATIVE  NEGATIVE Final  . Bilirubin Urine 11/28/2012 NEGATIVE  NEGATIVE Final  . Ketones, ur 11/28/2012 NEGATIVE  NEGATIVE mg/dL Final  . Protein, ur 09/81/1914 NEGATIVE  NEGATIVE mg/dL Final  . Urobilinogen, UA 11/28/2012 0.2  0.0 - 1.0 mg/dL Final  . Nitrite 78/29/5621 NEGATIVE  NEGATIVE Final  . Leukocytes, UA 11/28/2012 NEGATIVE  NEGATIVE Final   MICROSCOPIC NOT DONE ON URINES WITH NEGATIVE PROTEIN, BLOOD, LEUKOCYTES, NITRITE, OR GLUCOSE <1000 mg/dL.  Marland Kitchen MRSA, PCR 11/28/2012 NEGATIVE  NEGATIVE Final  . Staphylococcus aureus  11/28/2012 NEGATIVE  NEGATIVE Final   Comment:                                 The Xpert SA Assay (FDA                          approved for NASAL specimens                          in patients over 23 years of age),                          is one component of                          a comprehensive surveillance                          program.  Test performance has                          been validated by [employer] for patients greater                          than or equal to 74 year old.                          It is not intended                          to diagnose infection nor to                          guide or monitor treatment.     X-Rays:Dg Chest 2 View  11/28/2012   *RADIOLOGY REPORT*  Clinical Data: Preop for right total knee replacement  CHEST - 2 VIEW  Comparison: None.  Findings: No active infiltrate or effusion is seen.  Mediastinal contours appear normal.  The heart is within upper limits of normal in size.  There are mild degenerative changes in the lower thoracic spine.  IMPRESSION: No active lung disease.   Original Report Authenticated By: Dwyane Dee, M.D.    EKG: Orders placed during the hospital encounter of 11/28/12  . EKG 12-LEAD  . EKG 12-LEAD     Hospital Course: Alyssa Campbell is a 67 y.o. who was admitted to [facility]. They were brought to the operating room on 12/04/2012 and underwent Procedure(s): RIGHT TOTAL KNEE ARTHROPLASTY.  Patient tolerated the procedure well and was later transferred to the recovery room and then to the orthopaedic floor for postoperative care.  They were given PO and IV analgesics for pain control following their surgery.  They were given 24 hours of postoperative antibiotics of  Anti-infectives   Start     Dose/Rate Route Frequency Ordered Stop   12/04/12 1800  ceFAZolin (ANCEF) IVPB 1 g/50 mL premix     1 g 100 mL/hr over 30 Minutes Intravenous Every 6 hours 12/04/12 1459 12/04/12 2357     12/04/12 0900  ceFAZolin (ANCEF) IVPB 2 g/50 mL premix     2 g 100 mL/hr over 30 Minutes Intravenous On call to O.R. 12/04/12 0845 12/04/12 1147     and started on DVT prophylaxis in the form of Xarelto.   PT and OT were ordered for total joint protocol.  Discharge planning consulted to help with postop disposition and equipment needs.  Patient had a tough night on the evening of surgery.  They started to get up OOB with therapy on day one. Hemovac drain was pulled without difficulty.  Continued to work with therapy into day two.  Dressing was changed on day two and the incision was healing well.  By day three, the patient had progressed with therapy and meeting their goals.  Incision was healing well.  Patient was seen in rounds and was ready to go to [facility].   Discharge Medications: Prior to Admission medications   Medication Sig Start Date End Date Taking? Authorizing Provider  ALPRAZolam Prudy Feeler) 0.5 MG tablet Take 0.5 mg by mouth at bedtime as needed for sleep.   Yes Historical Provider, MD  atorvastatin (LIPITOR) 40 MG tablet Take 40 mg by mouth daily after supper.   Yes Historical Provider, MD  benazepril-hydrochlorthiazide (LOTENSIN HCT) 10-12.5 MG per tablet Take 1 tablet by mouth every morning.   Yes Historical Provider, MD  celecoxib (CELEBREX) 200 MG capsule Take 200 mg by mouth daily.   Yes Historical Provider, MD  lansoprazole (PREVACID) 30 MG capsule Take 30 mg by mouth daily.   Yes Historical Provider, MD  mirabegron ER (MYRBETRIQ) 50 MG TB24 Take 50 mg by mouth daily.   Yes Historical Provider, MD  mirtazapine (REMERON) 15 MG tablet Take 15 mg by mouth at bedtime.   Yes Historical Provider, MD  pramipexole (MIRAPEX) 0.25 MG tablet Take 0.25-0.5 mg by mouth at bedtime.   Yes Historical Provider, MD  acetaminophen (TYLENOL) 325 MG tablet Take 2 tablets (650 mg total) by mouth every 6 (six) hours as needed. 12/07/12   Shelsie Tijerino Julien Girt, PA-C  bisacodyl (DULCOLAX) 10 MG  suppository Place 1 suppository (10 mg total) rectally daily as needed. 12/07/12   Catlin Aycock Julien Girt, PA-C  diphenhydrAMINE (BENADRYL) 12.5 MG/5ML elixir Take 5-10 mLs (12.5-25 mg total) by mouth every 4 (four) hours as needed  for itching. 12/07/12   Imo Cumbie, PA-C  docusate sodium 100 MG CAPS Take 100 mg by mouth 2 (two) times daily. 12/07/12   Laterrica Libman Julien Girt, PA-C  methocarbamol (ROBAXIN) 500 MG tablet Take 1 tablet (500 mg total) by mouth every 6 (six) hours as needed. 12/07/12   Orlanda Lemmerman Julien Girt, PA-C  metoCLOPramide (REGLAN) 5 MG tablet Take 1-2 tablets (5-10 mg total) by mouth every 8 (eight) hours as needed (if ondansetron (ZOFRAN) ineffective.). 12/07/12   Marai Teehan, PA-C  ondansetron (ZOFRAN) 4 MG tablet Take 1 tablet (4 mg total) by mouth every 6 (six) hours as needed for nausea. 12/07/12   Lu Paradise, PA-C  oxyCODONE (OXY IR/ROXICODONE) 5 MG immediate release tablet Take 1-2 tablets (5-10 mg total) by mouth every 3 (three) hours as needed. 12/07/12   Jacqueline Delapena Julien Girt, PA-C  polycarbophil (FIBERCON) 625 MG tablet Take 1,250 mg by mouth daily.    Historical Provider, MD  polyethylene glycol (MIRALAX / GLYCOLAX) packet Take 17 g by mouth daily as needed. 12/07/12   Dedra Matsuo Julien Girt, PA-C  rivaroxaban (XARELTO) 10 MG TABS tablet Take 1 tablet (10 mg total) by mouth daily with breakfast. Take Xarelto for two and a half more weeks, then discontinue Xarelto. Once the patient has completed the Xarelto, they may resume the 81 mg Aspirin. 12/07/12   Bryanna Yim, PA-C  traMADol (ULTRAM) 50 MG tablet Take 1-2 tablets (50-100 mg total) by mouth every 6 (six) hours as needed (mild pain). 12/07/12   Charletha Dalpe Julien Girt, PA-C    Diet: Cardiac diet Activity:WBAT Follow-up:in 2 weeks Disposition - Skilled nursing facility - [facility] Discharged Condition: good   Discharge Orders   Future Orders Complete By Expires     Call MD / Call 911  As directed      Comments:      If you experience chest pain or shortness of breath, CALL 911 and be transported to the hospital emergency room.  If you develope a fever above 101 F, pus (white drainage) or increased drainage or redness at the wound, or calf pain, call your surgeon's office.    Change dressing  As directed     Comments:      Change dressing daily with sterile 4 x 4 inch gauze dressing and apply TED hose. Do not submerge the incision under water.    Constipation Prevention  As directed     Comments:      Drink plenty of fluids.  Prune juice may be helpful.  You may use a stool softener, such as Colace (over the counter) 100 mg twice a day.  Use MiraLax (over the counter) for constipation as needed.    Diet - low sodium heart healthy  As directed     Discharge instructions  As directed     Comments:      Pick up stool softner and laxative for home. Do not submerge incision under water. May shower. Continue to use ice for pain and swelling from surgery.  Take Xarelto for two and a half more weeks, then discontinue Xarelto. Once the patient has completed the Xarelto, they may resume the 81 mg Aspirin.  When discharged from the skilled rehab facility, please have the facility set up the patient's Home Health Physical Therapy prior to being released.  Also provide the patient with their medications at time of release from the facility to include their pain medication, the muscle relaxants, and their blood thinner medication.  If the patient is still at the  rehab facility at time of follow up appointment, please also assist the patient in arranging follow up appointment in our office and any transportation needs.    Do not put a pillow under the knee. Place it under the heel.  As directed     Do not sit on low chairs, stoools or toilet seats, as it may be difficult to get up from low surfaces  As directed     Driving restrictions  As directed     Comments:      No driving until released by the  physician.    Increase activity slowly as tolerated  As directed     Lifting restrictions  As directed     Comments:      No lifting until released by the physician.    Patient may shower  As directed     Comments:      You may shower without a dressing once there is no drainage.  Do not wash over the wound.  If drainage remains, do not shower until drainage stops.    TED hose  As directed     Comments:      Use stockings (TED hose) for 3 weeks on both leg(s).  You may remove them at night for sleeping.    Weight bearing as tolerated  As directed         Medication List    STOP taking these medications       aspirin 81 MG tablet     Calcium 500 MG tablet     CRANBERRY PLUS VITAMIN C 4200-20-3 MG-MG-UNIT Caps  Generic drug:  Cranberry-Vitamin C-Vitamin E     Fish Oil 1200 MG Caps     multivitamin with minerals Tabs     PROBIOTIC DAILY PO     vitamin B-12 1000 MCG tablet  Commonly known as:  CYANOCOBALAMIN     Vitamin D3 5000 UNITS Caps      TAKE these medications       acetaminophen 325 MG tablet  Commonly known as:  TYLENOL  Take 2 tablets (650 mg total) by mouth every 6 (six) hours as needed.     ALPRAZolam 0.5 MG tablet  Commonly known as:  XANAX  Take 0.5 mg by mouth at bedtime as needed for sleep.     atorvastatin 40 MG tablet  Commonly known as:  LIPITOR  Take 40 mg by mouth daily after supper.     benazepril-hydrochlorthiazide 10-12.5 MG per tablet  Commonly known as:  LOTENSIN HCT  Take 1 tablet by mouth every morning.     bisacodyl 10 MG suppository  Commonly known as:  DULCOLAX  Place 1 suppository (10 mg total) rectally daily as needed.     celecoxib 200 MG capsule  Commonly known as:  CELEBREX  Take 200 mg by mouth daily.     diphenhydrAMINE 12.5 MG/5ML elixir  Commonly known as:  BENADRYL  Take 5-10 mLs (12.5-25 mg total) by mouth every 4 (four) hours as needed for itching.     DSS 100 MG Caps  Take 100 mg by mouth 2 (two) times daily.       lansoprazole 30 MG capsule  Commonly known as:  PREVACID  Take 30 mg by mouth daily.     methocarbamol 500 MG tablet  Commonly known as:  ROBAXIN  Take 1 tablet (500 mg total) by mouth every 6 (six) hours as needed.     metoCLOPramide 5 MG tablet  Commonly  known as:  REGLAN  Take 1-2 tablets (5-10 mg total) by mouth every 8 (eight) hours as needed (if ondansetron (ZOFRAN) ineffective.).     mirtazapine 15 MG tablet  Commonly known as:  REMERON  Take 15 mg by mouth at bedtime.     MYRBETRIQ 50 MG Tb24  Generic drug:  mirabegron ER  Take 50 mg by mouth daily.     ondansetron 4 MG tablet  Commonly known as:  ZOFRAN  Take 1 tablet (4 mg total) by mouth every 6 (six) hours as needed for nausea.     oxyCODONE 5 MG immediate release tablet  Commonly known as:  Oxy IR/ROXICODONE  Take 1-2 tablets (5-10 mg total) by mouth every 3 (three) hours as needed.     polycarbophil 625 MG tablet  Commonly known as:  FIBERCON  Take 1,250 mg by mouth daily.     polyethylene glycol packet  Commonly known as:  MIRALAX / GLYCOLAX  Take 17 g by mouth daily as needed.     pramipexole 0.25 MG tablet  Commonly known as:  MIRAPEX  Take 0.25-0.5 mg by mouth at bedtime.     rivaroxaban 10 MG Tabs tablet  Commonly known as:  XARELTO  Take 1 tablet (10 mg total) by mouth daily with breakfast. Take Xarelto for two and a half more weeks, then discontinue Xarelto.  Once the patient has completed the Xarelto, they may resume the 81 mg Aspirin.     traMADol 50 MG tablet  Commonly known as:  ULTRAM  Take 1-2 tablets (50-100 mg total) by mouth every 6 (six) hours as needed (mild pain).           Follow-up Information   Follow up with Loanne Drilling, MD. Schedule an appointment as soon as possible for a visit on 12/19/2012.   Contact information:   [address] [place] [address] [phone]       Signed: Patrica Duel 12/07/2012,  8:11 AM

## 2012-12-07 NOTE — Progress Notes (Signed)
Physical Therapy Treatment Patient Details Name: Alyssa Campbell MRN: [id number] DOB: 04-26-1946 Today's Date: 12/07/2012 Time: 6301-6010 PT Time Calculation (min): 28 min  PT Assessment / Plan / Recommendation Comments on Treatment Session  Pt performed IND SLR this date - ambulating without KI    Follow Up Recommendations  SNF     Does the patient have the potential to tolerate intense rehabilitation     Barriers to Discharge        Equipment Recommendations  None recommended by PT    Recommendations for Other Services    Frequency 7X/week   Plan Discharge plan remains appropriate;Frequency remains appropriate    Precautions / Restrictions Precautions Precautions: Knee Required Braces or Orthoses: Knee Immobilizer - Right Knee Immobilizer - Right: Discontinue once straight leg raise with < 10 degree lag Restrictions Weight Bearing Restrictions: No RLE Weight Bearing: Weight bearing as tolerated   Pertinent Vitals/Pain 3-4/10; premed, ice packs provided    Mobility  Bed Mobility Bed Mobility: Sit to Supine Sit to Supine: 4: Min guard Details for Bed Mobility Assistance: Pt self assisting L LE with R LE Transfers Transfers: Sit to Stand;Stand to Sit Sit to Stand: 4: Min guard Stand to Sit: 4: Min guard Details for Transfer Assistance: verbal cues for hand placement and RLE management Ambulation/Gait Ambulation/Gait Assistance: 4: Min assist;4: Min Government social research officer (Feet): 159 Feet Assistive device: Rolling walker Ambulation/Gait Assistance Details: min cues for position from RW and posture Gait Pattern: Step-to pattern;Step-through pattern General Gait Details: progressing to recip gait    Exercises Total Joint Exercises Ankle Circles/Pumps: AROM;Both;20 reps Quad Sets: AROM;Both;20 reps Heel Slides: AAROM;Right;20 reps Straight Leg Raises: AAROM;Right;20 reps;Supine;AROM Long Arc Quad: AAROM;AROM;Supine;Both;10 reps   PT Diagnosis:    PT Problem  List:   PT Treatment Interventions:     PT Goals Acute Rehab PT Goals PT Goal Formulation: With patient Time For Goal Achievement: 12/12/12 Potential to Achieve Goals: Good Pt will go Supine/Side to Sit: with supervision PT Goal: Supine/Side to Sit - Progress: Progressing toward goal Pt will go Sit to Stand: with supervision PT Goal: Sit to Stand - Progress: Progressing toward goal Pt will Ambulate: 51 - 150 feet;with supervision;with rolling walker PT Goal: Ambulate - Progress: Progressing toward goal Pt will Perform Home Exercise Program: with supervision, verbal cues required/provided PT Goal: Perform Home Exercise Program - Progress: Progressing toward goal  Visit Information  Last PT Received On: 12/07/12 Assistance Needed: +1    Subjective Data  Patient Stated Goal: rehab   Cognition  Cognition Arousal/Alertness: Awake/alert Behavior During Therapy: WFL for tasks assessed/performed Overall Cognitive Status: Within Functional Limits for tasks assessed    Balance     End of Session PT - End of Session Activity Tolerance: Patient tolerated treatment well Patient left: in bed;with call bell/phone within reach;with family/visitor present Nurse Communication: Mobility status   GP     Alyssa Campbell 12/07/2012, 12:13 PM

## 2012-12-14 ENCOUNTER — Non-Acute Institutional Stay (SKILLED_NURSING_FACILITY): Payer: Medicare Other | Admitting: Internal Medicine

## 2012-12-14 DIAGNOSIS — E876 Hypokalemia: Secondary | ICD-10-CM

## 2012-12-14 DIAGNOSIS — D62 Acute posthemorrhagic anemia: Secondary | ICD-10-CM

## 2012-12-14 DIAGNOSIS — M1710 Unilateral primary osteoarthritis, unspecified knee: Secondary | ICD-10-CM

## 2012-12-14 DIAGNOSIS — I10 Essential (primary) hypertension: Secondary | ICD-10-CM

## 2012-12-19 ENCOUNTER — Encounter: Payer: Self-pay | Admitting: Adult Health

## 2013-01-08 DIAGNOSIS — I10 Essential (primary) hypertension: Secondary | ICD-10-CM | POA: Insufficient documentation

## 2013-01-08 DIAGNOSIS — E876 Hypokalemia: Secondary | ICD-10-CM | POA: Insufficient documentation

## 2013-01-08 NOTE — Progress Notes (Signed)
Patient ID: Alyssa Campbell, female   DOB: 12-Jun-1946, 67 y.o.   MRN: [id number]        HISTORY & PHYSICAL  DATE: 12/14/2012   FACILITY: [facility]  LEVEL OF CARE: SNF (31)  ALLERGIES:  Allergies  Allergen Reactions  . Adhesive (Tape) Other (See Comments)    welts  . Erythromycin Rash    CHIEF COMPLAINT:  Manage right knee osteoarthritis, acute blood loss anemia, and hypertension.    HISTORY OF PRESENT ILLNESS:  The patient is a 67 year-old, Caucasian female.  KNEE OSTEOARTHRITIS: Patient had a history of pain and functional disability in the knee due to end-stage osteoarthritis and has failed nonsurgical conservative treatments. Patient had worsening of pain with activity and weight bearing, pain that interfered with activities of daily living & pain with passive range of motion. Therefore patient underwent total knee arthroplasty and tolerated the procedure well. Patient is admitted to this facility for sort short-term rehabilitation. Patient denies knee pain.    ANEMIA:  Postoperatively, patient suffered acute blood loss.   The anemia has been stable. The patient denies fatigue, melena or hematochezia.  The patient is currently not on iron.    Last hemoglobin level is:   10.  HTN: Pt 's HTN remains stable.  Denies CP, sob, DOE, pedal edema, headaches, dizziness or visual disturbances.  No complications from the medications currently being used.  Last BP :  95/62, 98/69, 98/98.   PAST MEDICAL HISTORY :  Past Medical History  Diagnosis Date  . Hypertension   . GERD (gastroesophageal reflux disease)   . Diverticulosis     no recent flares  . Restless leg syndrome   . Urinary incontinence     controlled with meds  . Arthritis   . Complication of anesthesia 2006    bp dropped  after left hip replacment, had general    PAST SURGICAL HISTORY: Past Surgical History  Procedure Laterality Date  . Hernia repair  10-28-2004  . Cholecystectomy  10-28-2004  . Cardiac  catheterization  2006     results normal  . Left total hip arthroplasty  11-2004  . Right total hip replacment  05-12-2011  . Total knee arthroplasty Right 12/04/2012    Procedure: RIGHT TOTAL KNEE ARTHROPLASTY;  Surgeon: Loanne Drilling, MD;  Location: [facility];  Service: Orthopedics;  Laterality: Right;    SOCIAL HISTORY:  reports that she has never smoked. She has never used smokeless tobacco. She reports that  drinks alcohol. She reports that she does not use illicit drugs.  FAMILY HISTORY: None  CURRENT MEDICATIONS: Reviewed per MAR  REVIEW OF SYSTEMS:  See HPI otherwise 14 point ROS is negative.  PHYSICAL EXAMINATION  VS:  T 97.1       P 97      RR 20      BP 95/62      POX 90% room air       WT (Lb)  GENERAL: no acute distress, moderate obese body habitus SKIN: warm & dry, no suspicious lesions or rashes, no excessive dryness, right knee incision clean and dry, Steri-Strips are in place EYES: conjunctivae normal, sclerae normal, normal eye lids MOUTH/THROAT: lips without lesions,no lesions in the mouth,tongue is without lesions,uvula elevates in midline NECK: supple, trachea midline, no neck masses, no thyroid tenderness, no thyromegaly LYMPHATICS: no LAN in the neck, no supraclavicular LAN RESPIRATORY: breathing is even & unlabored, BS CTAB CARDIAC: RRR, no murmur,no extra heart sounds, no  edema GI:  ABDOMEN: abdomen soft, normal BS, no masses, no tenderness  LIVER/SPLEEN: no hepatomegaly, no splenomegaly MUSCULOSKELETAL: HEAD: normal to inspection & palpation BACK: no kyphosis, scoliosis or spinal processes tenderness EXTREMITIES: LEFT UPPER EXTREMITY: full range of motion, normal strength & tone RIGHT UPPER EXTREMITY:  full range of motion, normal strength & tone LEFT LOWER EXTREMITY:  full range of motion, normal strength & tone RIGHT LOWER EXTREMITY: strength intact, range of motion not tested due to surgery, right knee edematous   PSYCHIATRIC: the patient is alert &  oriented to person, affect & behavior appropriate  LABS/RADIOLOGY: Hemoglobin 10, MCV 92, otherwise CBC normal.    Potassium 3.3, glucose 124, otherwise BMP normal.    Chest x-ray:  No acute disease.    Urinalysis negative.    PT 12.6, INR 0.95, PTT 32.    MRSA by PCR negative.    Staph aureus by PCR negative.    Liver profile normal.    ASSESSMENT/PLAN:  Right knee osteoarthritis.  Status post right total knee arthroplasty.  Continue rehabilitation.   Acute blood loss anemia.  Hemoglobin improved.     Hypertension.  Adequately controlled.  Hypokalemia.  New problem.  KCl was started.  Recheck pending.  Check magnesium level.   Hyperglycemia.  New problem.  Check fasting glucose level and hemoglobin A1c.    GERD.  Well controlled.     Restless legs syndrome.  Continue Mirapex.    I have reviewed patient's medical records received at admission/from hospitalization.  CPT CODE: 16109

## 2013-07-10 NOTE — Progress Notes (Signed)
This encounter was created in error - please disregard.

## 2013-11-13 ENCOUNTER — Other Ambulatory Visit: Payer: Self-pay | Admitting: Orthopedic Surgery

## 2013-11-13 NOTE — H&P (Signed)
Alyssa Campbell DOB: [birth year] Married / Language: English / Race: White Female Date of Admission:  12/03/2013 Chief Complaint:  Left Knee Pain History of Present Illness The patient is a 68 year old female who comes in for a preoperative History and Physical. The patient is scheduled for a left total knee arthroplasty to be performed by Dr. Gus Rankin. Aluisio, MD at [facility] on 12-03-2013. The patient reports left knee symptoms including: pain which began several month(s) ago without any known injury. The patient describes the severity of the symptoms as moderate in severity. The patient describes their pain as aching. Symptoms are reported to be located in the left knee. She is having alot of pain behind the knee, that radiates down the calf, as well as instability. She did not have an injury but was standing on her feet for several hours in November putting up her Christmas.  She has significant arthritis found to be in the knee and would like to proceed with surgery. She has done well with the right knee replacement and now wishes to proceed with the left knee replacement. They have been treated conservatively in the past for the above stated problem and despite conservative measures, they continue to have progressive pain and severe functional limitations and dysfunction. They have failed non-operative management including home exercise, medications. It is felt that they would benefit from undergoing total joint replacement. Risks and benefits of the procedure have been discussed with the patient and they elect to proceed with surgery. There are no active contraindications to surgery such as ongoing infection or rapidly progressive neurological disease.  Allergies Erythromycin *MACROLIDES*. Rash. Band-Aid *MEDICAL DEVICES*. Adhesive - NOT LATEX ALLERGY. SHE IS ABLE TO USE PAPER TAPE.   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 mother Osteoarthritis. grandmother mothers side Chronic Obstructive Lung Disease. Mother. mother Cancer. First Degree Relatives. father    Social History Tobacco / smoke exposure. no Drug/Alcohol Rehab (Currently). no Post-Surgical Plans. [facility] in [place], [place] Previously in rehab. no Alcohol use. current drinker; drinks hard liquor; only occasionally per week Living situation. live with spouse Illicit drug use. no Marital status. married Pain Contract. no Exercise. Exercises rarely Number of flights of stairs before winded. 1 Current work status. retired Merchant [employer] officer. Living Will, Healthcare POA Tobacco use. Never smoker. never smoker Children. 0 Most recent primary occupation. Retired Programmer, systems, current Education officer, museum   Medication History Myrbetriq (50MG  Tablet ER 24HR, Oral) Active. Xanax (0.5MG  Tablet, Oral) Active. (prn) Benazepril-Hydrochlorothiazide (10-12.5MG  Tablet, Oral) Active. Mirapex (0.25MG  Tablet, Oral) Active. Lansoprazole (30MG  Capsule DR, Oral) Active. Probiotic ( Oral) Active. Vitamin B12 TR ( Oral) Specific dose unknown - Active. CoQ10 (100MG  Capsule, Oral) Active. Garcinia Cambogia-Chromium (500-200MG -MCG Tablet, Oral) Active.   Past Surgical History Total Hip Replacement. bilateral Other Surgery. Belly button hernia Tubal Ligation Cardiac Cath. Date: 2006. Gallbladder Surgery. laporoscopic; Umbilical Hernia Repair Total Knee Replacement - Right. Date: 11/2012.   Medical History High blood pressure Osteoarthritis Hypercholesterolemia Diverticulosis Restless Leg Syndrome Urinary Incontinence Gastroesophageal Reflux Disease Overactive Bladder    Review of Systems General:Not Present- Chills, Fever, Night Sweats, Appetite Loss, Fatigue, Feeling sick, Weight Gain and Weight Loss. Skin:Not Present- Itching, Rash, Skin Color Changes,  Ulcer, Psoriasis and Change in Hair or Nails. HEENT:Not Present- Sensitivity to light, Nose Bleed, Visual Loss, Decreased Hearing and Ringing in the Ears. Neck:Not Present- Swollen Glands and Neck Mass.  Respiratory:Not Present- Shortness of breath, Snoring, Chronic Cough and Bloody sputum. Cardiovascular:Not Present- Shortness of Breath, Chest Pain, Swelling of Extremities, Leg Cramps and Palpitations. Gastrointestinal:Present- Heartburn. Not Present- Bloody Stool, Abdominal Pain, Vomiting, Nausea and Incontinence of Stool. Female Genitourinary:Present- Incontinence. Not Present- Blood in Urine, Irregular/missing periods, Frequency and Nocturia. Musculoskeletal:Present- Joint Pain. Not Present- Muscle Weakness, Muscle Pain, Joint Stiffness, Joint Swelling and Back Pain. Neurological:Not Present- Tingling, Numbness, Burning, Tremor, Headaches and Dizziness. Psychiatric:Not Present- Anxiety, Depression and Memory Loss. Endocrine:Not Present- Cold Intolerance, Heat Intolerance, Excessive hunger and Excessive Thirst. Hematology:Not Present- Abnormal Bleeding, Abnormal bruising, Anemia and Blood Clots.    Vitals Weight: 211 lb Height: 66 in Weight was reported by patient. Height was reported by patient. Body Surface Area: 2.11 m Body Mass Index: 34.06 kg/m Pulse: 84 (Regular) Resp.: 16 (Unlabored) BP: 108/58 (Sitting, Right Arm, Standard)   Physical Exam The physical exam findings are as follows:  Note: Patient is a 68 year old female with continued knee pain. Patient is accompanied today by her husband.   General Mental Status - Alert, cooperative and good historian. General Appearance- pleasant. Not in acute distress. Orientation- Oriented X3. Build & Nutrition- Well nourished and Well developed.   Head and Neck Head- normocephalic, atraumatic . Neck Global Assessment- supple. no bruit auscultated on the right and no bruit auscultated on the  left.   Eye Pupil- Bilateral- Regular and Round. Motion- Bilateral- EOMI.   Chest and Lung Exam Auscultation: Breath sounds:- clear at anterior chest wall and - clear at posterior chest wall. Adventitious sounds:- No Adventitious sounds.   Cardiovascular Auscultation:Rhythm- Regular rate and rhythm. Heart Sounds- S1 WNL and S2 WNL. Murmurs & Other Heart Sounds:Auscultation of the heart reveals - No Murmurs.   Abdomen Palpation/Percussion:Tenderness- Abdomen is non-tender to palpation. Rigidity (guarding)- Abdomen is soft. Auscultation:Auscultation of the abdomen reveals - Bowel sounds normal.   Female Genitourinary Not done, not pertinent to present illness  Musculoskeletal Well developed female in no distress. Her left knee shows slight effusion. Her range of motion is about 5 to 125. Slight varus in the knee. Marked crepitus on range of motion. Tenderness medial and lateral with no instability noted. Pulses, sensation and motor are intact both lower extremities.  RADIOGRAPHS: AP both knees and lateral of both show the prosthesis on the right in excellent position with no periprosthetic abnormalities. On the left she has bone on bone arthritis in the medial and patellofemoral compartments.   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 Lequita HaltAluisio.  Plan [place]  PCP - Dr. Melvyn NethLewis - Patient has been seen preoperatively and felt to be stable for surgery.  The patient does not have any contraindications and will receive TXA (tranexamic acid) prior to surgery.  Signed electronically by Lauraine RinneAlexzandrew L Britley Gashi, III PA-C

## 2013-11-14 NOTE — Progress Notes (Signed)
Avel Peacerew Perkins, PA - Please enter preop orders in Epic for [facility] - surg date is 6/8.  Only preop order you entered was tranexamic acid.

## 2013-11-16 ENCOUNTER — Encounter (HOSPITAL_COMMUNITY): Payer: Self-pay | Admitting: Pharmacy Technician

## 2013-11-19 ENCOUNTER — Other Ambulatory Visit: Payer: Self-pay | Admitting: Orthopedic Surgery

## 2013-11-21 NOTE — Patient Instructions (Signed)
Alyssa Campbell  11/21/2013   Your procedure is scheduled on:  12/03/2013  145pm-235pm  Report to [facility].  Follow the Signs to Short Stay Center at       1045     am  Call this number if you have problems the morning of surgery: [phone]   Remember:   Do not eat food after midnite.               May have clear liquids until 0800am the morning of surgery .    Take these medicines the morning of surgery with A SIP OF WATER:    Do not wear jewelry, make-up or nail polish.  Do not wear lotions, powders, or perfumes.   Do not shave 48 hours prior to surgery.   Do not bring valuables to the hospital.  Contacts, dentures or bridgework may not be worn into surgery.  Leave suitcase in the car. After surgery it may be brought to your room.  For patients admitted to the hospital, checkout time is 11:00 AM the day of  discharge.       [facility] - Preparing for Surgery Before surgery, you can play an important role.  Because skin is not sterile, your skin needs to be as free of germs as possible.  You can reduce the number of germs on your skin by washing with CHG (chlorahexidine gluconate) soap before surgery.  CHG is an antiseptic cleaner which kills germs and bonds with the skin to continue killing germs even after washing. Please DO NOT use if you have an allergy to CHG or antibacterial soaps.  If your skin becomes reddened/irritated stop using the CHG and inform your nurse when you arrive at Short Stay. Do not shave (including legs and underarms) for at least 48 hours prior to the first CHG shower.  You may shave your face/neck. Please follow these instructions carefully:  1.  Shower with CHG Soap the night before surgery and the  morning of Surgery.  2.  If you choose to wash your hair, wash your hair first as usual with your  normal  shampoo.  3.  After you shampoo, rinse your hair and body thoroughly to remove the  shampoo.                           4.  Use CHG as you would  any other liquid soap.  You can apply chg directly  to the skin and wash                       Gently with a scrungie or clean washcloth.  5.  Apply the CHG Soap to your body ONLY FROM THE NECK DOWN.   Do not use on face/ open                           Wound or open sores. Avoid contact with eyes, ears mouth and genitals (private parts).                       Wash face,  Genitals (private parts) with your normal soap.             6.  Wash thoroughly, paying special attention to the area where your surgery  will be performed.  7.  Thoroughly rinse your body with warm water from the  neck down.  8.  DO NOT shower/wash with your normal soap after using and rinsing off  the CHG Soap.                9.  Pat yourself dry with a clean towel.            10.  Wear clean pajamas.            11.  Place clean sheets on your bed the night of your first shower and do not  sleep with pets. Day of Surgery : Do not apply any lotions/deodorants the morning of surgery.  Please wear clean clothes to the hospital/surgery center.  FAILURE TO FOLLOW THESE INSTRUCTIONS MAY RESULT IN THE CANCELLATION OF YOUR SURGERY PATIENT SIGNATURE_________________________________  NURSE SIGNATURE__________________________________  ________________________________________________________________________    CLEAR LIQUID DIET   Foods Allowed                                                                     Foods Excluded  Coffee and tea, regular and decaf                             liquids that you cannot  Plain Jell-O in any flavor                                             see through such as: Fruit ices (not with fruit pulp)                                     milk, soups, orange juice  Iced Popsicles                                    All solid food Carbonated beverages, regular and diet                                    Cranberry, grape and apple juices Sports drinks like Gatorade Lightly seasoned clear broth or  consume(fat free) Sugar, honey syrup  Sample Menu Breakfast                                Lunch                                     Supper Cranberry juice                    Beef broth                            Chicken broth Jell-O  Grape juice                           Apple juice Coffee or tea                        Jell-O                                      Popsicle                                                Coffee or tea                        Coffee or tea  _____________________________________________________________________   WHAT IS A BLOOD TRANSFUSION? Blood Transfusion Information  A transfusion is the replacement of blood or some of its parts. Blood is made up of multiple cells which provide different functions.  Red blood cells carry oxygen and are used for blood loss replacement.  White blood cells fight against infection.  Platelets control bleeding.  Plasma helps clot blood.  Other blood products are available for specialized needs, such as hemophilia or other clotting disorders. BEFORE THE TRANSFUSION  Who gives blood for transfusions?   Healthy volunteers who are fully evaluated to make sure their blood is safe. This is blood bank blood. Transfusion therapy is the safest it has ever been in the practice of medicine. Before blood is taken from a donor, a complete history is taken to make sure that person has no history of diseases nor engages in risky social behavior (examples are intravenous drug use or sexual activity with multiple partners). The donor's travel history is screened to minimize risk of transmitting infections, such as malaria. The donated blood is tested for signs of infectious diseases, such as HIV and hepatitis. The blood is then tested to be sure it is compatible with you in order to minimize the chance of a transfusion reaction. If you or a relative donates blood, this is often done in anticipation of surgery  and is not appropriate for emergency situations. It takes many days to process the donated blood. RISKS AND COMPLICATIONS Although transfusion therapy is very safe and saves many lives, the main dangers of transfusion include:   Getting an infectious disease.  Developing a transfusion reaction. This is an allergic reaction to something in the blood you were given. Every precaution is taken to prevent this. The decision to have a blood transfusion has been considered carefully by your caregiver before blood is given. Blood is not given unless the benefits outweigh the risks. AFTER THE TRANSFUSION  Right after receiving a blood transfusion, you will usually feel much better and more energetic. This is especially true if your red blood cells have gotten low (anemic). The transfusion raises the level of the red blood cells which carry oxygen, and this usually causes an energy increase.  The nurse administering the transfusion will monitor you carefully for complications. HOME CARE INSTRUCTIONS  No special instructions are needed after a transfusion. You may find your energy is better. Speak with your caregiver about any limitations on activity for underlying diseases you may have. SEEK MEDICAL CARE IF:  Your condition is not improving after your transfusion.  You develop redness or irritation at the intravenous (IV) site. SEEK IMMEDIATE MEDICAL CARE IF:  Any of the following symptoms occur over the next 12 hours:  Shaking chills.  You have a temperature by mouth above 102 F (38.9 C), not controlled by medicine.  Chest, back, or muscle pain.  People around you feel you are not acting correctly or are confused.  Shortness of breath or difficulty breathing.  Dizziness and fainting.  You get a rash or develop hives.  You have a decrease in urine output.  Your urine turns a dark color or changes to pink, red, or brown. Any of the following symptoms occur over the next 10  days:  You have a temperature by mouth above 102 F (38.9 C), not controlled by medicine.  Shortness of breath.  Weakness after normal activity.  The white part of the eye turns yellow (jaundice).  You have a decrease in the amount of urine or are urinating less often.  Your urine turns a dark color or changes to pink, red, or brown. Document Released: 06/11/2000 Document Revised: 09/06/2011 Document Reviewed: 01/29/2008 ExitCare Patient Information 2014 [place], [place].  _______________________________________________________________________  Incentive Spirometer  An incentive spirometer is a tool that can help keep your lungs clear and active. This tool measures how well you are filling your lungs with each breath. Taking long deep breaths may help reverse or decrease the chance of developing breathing (pulmonary) problems (especially infection) following:  A long period of time when you are unable to move or be active. BEFORE THE PROCEDURE   If the spirometer includes an indicator to show your best effort, your nurse or respiratory therapist will set it to a desired goal.  If possible, sit up straight or lean slightly forward. Try not to slouch.  Hold the incentive spirometer in an upright position. INSTRUCTIONS FOR USE  1. Sit on the edge of your bed if possible, or sit up as far as you can in bed or on a chair. 2. Hold the incentive spirometer in an upright position. 3. Breathe out normally. 4. Place the mouthpiece in your mouth and seal your lips tightly around it. 5. Breathe in slowly and as deeply as possible, raising the piston or the ball toward the top of the column. 6. Hold your breath for 3-5 seconds or for as long as possible. Allow the piston or ball to fall to the bottom of the column. 7. Remove the mouthpiece from your mouth and breathe out normally. 8. Rest for a few seconds and repeat Steps 1 through 7 at least 10 times every 1-2 hours when you are awake.  Take your time and take a few normal breaths between deep breaths. 9. The spirometer may include an indicator to show your best effort. Use the indicator as a goal to work toward during each repetition. 10. After each set of 10 deep breaths, practice coughing to be sure your lungs are clear. If you have an incision (the cut made at the time of surgery), support your incision when coughing by placing a pillow or rolled up towels firmly against it. Once you are able to get out of bed, walk around indoors and cough well. You may stop using the incentive spirometer when instructed by your caregiver.  RISKS AND COMPLICATIONS  Take your time so you do not get dizzy or light-headed.  If you are in pain, you may need to take or ask  for pain medication before doing incentive spirometry. It is harder to take a deep breath if you are having pain. AFTER USE  Rest and breathe slowly and easily.  It can be helpful to keep track of a log of your progress. Your caregiver can provide you with a simple table to help with this. If you are using the spirometer at home, follow these instructions: SEEK MEDICAL CARE IF:   You are having difficultly using the spirometer.  You have trouble using the spirometer as often as instructed.  Your pain medication is not giving enough relief while using the spirometer.  You develop fever of 100.5 F (38.1 C) or higher. SEEK IMMEDIATE MEDICAL CARE IF:   You cough up bloody sputum that had not been present before.  You develop fever of 102 F (38.9 C) or greater.  You develop worsening pain at or near the incision site. MAKE SURE YOU:   Understand these instructions.  Will watch your condition.  Will get help right away if you are not doing well or get worse. Document Released: 10/25/2006 Document Revised: 09/06/2011 Document Reviewed: 12/26/2006 ExitCare Patient Information 2014 ExitCare,  [place].   ________________________________________________________________________    Please read over the following fact sheets that you were given: MRSA Information, coughing and deep breathing exercises, leg exercises

## 2013-11-22 ENCOUNTER — Encounter (HOSPITAL_COMMUNITY)
Admission: RE | Admit: 2013-11-22 | Discharge: 2013-11-22 | Disposition: A | Discharge status: Home / Self Care | Payer: Medicare Other | Source: Ambulatory Visit | Attending: Orthopedic Surgery | Admitting: Orthopedic Surgery

## 2013-11-22 ENCOUNTER — Ambulatory Visit (HOSPITAL_COMMUNITY)
Admission: RE | Admit: 2013-11-22 | Discharge: 2013-11-22 | Disposition: A | Discharge status: Home / Self Care | Payer: Medicare Other | Source: Ambulatory Visit | Attending: Orthopedic Surgery | Admitting: Orthopedic Surgery

## 2013-11-22 ENCOUNTER — Encounter (HOSPITAL_COMMUNITY): Payer: Self-pay

## 2013-11-22 DIAGNOSIS — I10 Essential (primary) hypertension: Secondary | ICD-10-CM | POA: Insufficient documentation

## 2013-11-22 DIAGNOSIS — Z01818 Encounter for other preprocedural examination: Secondary | ICD-10-CM | POA: Insufficient documentation

## 2013-11-22 DIAGNOSIS — Z01812 Encounter for preprocedural laboratory examination: Secondary | ICD-10-CM | POA: Insufficient documentation

## 2013-11-22 DIAGNOSIS — Z01810 Encounter for preprocedural cardiovascular examination: Secondary | ICD-10-CM | POA: Insufficient documentation

## 2013-11-22 HISTORY — DX: Cardiac murmur, unspecified: R01.1

## 2013-11-22 LAB — URINALYSIS, ROUTINE W REFLEX MICROSCOPIC
Bilirubin Urine: NEGATIVE
Glucose, UA: NEGATIVE mg/dL
HGB URINE DIPSTICK: NEGATIVE
Ketones, ur: NEGATIVE mg/dL
Leukocytes, UA: NEGATIVE
NITRITE: NEGATIVE
PH: 7 (ref 5.0–8.0)
Protein, ur: NEGATIVE mg/dL
SPECIFIC GRAVITY, URINE: 1.004 — AB (ref 1.005–1.030)
UROBILINOGEN UA: 0.2 mg/dL (ref 0.0–1.0)

## 2013-11-22 LAB — COMPREHENSIVE METABOLIC PANEL
ALBUMIN: 4 g/dL (ref 3.5–5.2)
ALK PHOS: 61 U/L (ref 39–117)
ALT: 23 U/L (ref 0–35)
AST: 18 U/L (ref 0–37)
BILIRUBIN TOTAL: 0.4 mg/dL (ref 0.3–1.2)
BUN: 15 mg/dL (ref 6–23)
CHLORIDE: 101 meq/L (ref 96–112)
CO2: 29 mEq/L (ref 19–32)
Calcium: 9.9 mg/dL (ref 8.4–10.5)
Creatinine, Ser: 0.82 mg/dL (ref 0.50–1.10)
GFR calc Af Amer: 85 mL/min — ABNORMAL LOW (ref >90)
GFR calc non Af Amer: 73 mL/min — ABNORMAL LOW (ref >90)
Glucose, Bld: 125 mg/dL — ABNORMAL HIGH (ref 70–99)
POTASSIUM: 3.5 meq/L — AB (ref 3.7–5.3)
SODIUM: 143 meq/L (ref 137–147)
TOTAL PROTEIN: 7.1 g/dL (ref 6.0–8.3)

## 2013-11-22 LAB — SURGICAL PCR SCREEN
MRSA, PCR: NEGATIVE
Staphylococcus aureus: NEGATIVE

## 2013-11-22 LAB — CBC
HEMATOCRIT: 39.5 % (ref 36.0–46.0)
Hemoglobin: 12.9 g/dL (ref 12.0–15.0)
MCH: 30.9 pg (ref 26.0–34.0)
MCHC: 32.7 g/dL (ref 30.0–36.0)
MCV: 94.7 fL (ref 78.0–100.0)
PLATELETS: 244 10*3/uL (ref 150–400)
RBC: 4.17 MIL/uL (ref 3.87–5.11)
RDW: 13.6 % (ref 11.5–15.5)
WBC: 7 10*3/uL (ref 4.0–10.5)

## 2013-11-22 LAB — PROTIME-INR
INR: 0.97 (ref 0.00–1.49)
Prothrombin Time: 12.7 seconds (ref 11.6–15.2)

## 2013-11-22 LAB — APTT: aPTT: 32 seconds (ref 24–37)

## 2013-12-03 ENCOUNTER — Encounter (HOSPITAL_COMMUNITY)
Admission: RE | Disposition: A | Discharge status: Skilled Nursing Facility | Payer: Self-pay | Source: Ambulatory Visit | Attending: Orthopedic Surgery

## 2013-12-03 ENCOUNTER — Inpatient Hospital Stay (HOSPITAL_COMMUNITY): Payer: Medicare Other | Admitting: Anesthesiology

## 2013-12-03 ENCOUNTER — Encounter (HOSPITAL_COMMUNITY): Payer: Self-pay | Admitting: *Deleted

## 2013-12-03 ENCOUNTER — Inpatient Hospital Stay (HOSPITAL_COMMUNITY)
Admission: RE | Admit: 2013-12-03 | Discharge: 2013-12-06 | DRG: 470 | Disposition: A | Discharge status: Skilled Nursing Facility | Payer: Medicare Other | Source: Ambulatory Visit | Attending: Orthopedic Surgery | Admitting: Orthopedic Surgery

## 2013-12-03 ENCOUNTER — Encounter (HOSPITAL_COMMUNITY): Payer: Medicare Other | Admitting: Anesthesiology

## 2013-12-03 DIAGNOSIS — I10 Essential (primary) hypertension: Secondary | ICD-10-CM | POA: Diagnosis present

## 2013-12-03 DIAGNOSIS — R32 Unspecified urinary incontinence: Secondary | ICD-10-CM | POA: Diagnosis present

## 2013-12-03 DIAGNOSIS — M1710 Unilateral primary osteoarthritis, unspecified knee: Principal | ICD-10-CM | POA: Diagnosis present

## 2013-12-03 DIAGNOSIS — Z6834 Body mass index (BMI) 34.0-34.9, adult: Secondary | ICD-10-CM

## 2013-12-03 DIAGNOSIS — Z96659 Presence of unspecified artificial knee joint: Secondary | ICD-10-CM

## 2013-12-03 DIAGNOSIS — K219 Gastro-esophageal reflux disease without esophagitis: Secondary | ICD-10-CM | POA: Diagnosis present

## 2013-12-03 DIAGNOSIS — M179 Osteoarthritis of knee, unspecified: Secondary | ICD-10-CM

## 2013-12-03 HISTORY — PX: TOTAL KNEE ARTHROPLASTY: SHX125

## 2013-12-03 LAB — TYPE AND SCREEN
ABO/RH(D): O POS
Antibody Screen: NEGATIVE

## 2013-12-03 SURGERY — ARTHROPLASTY, KNEE, TOTAL
Anesthesia: Spinal | Site: Knee | Laterality: Left

## 2013-12-03 MED ORDER — ONDANSETRON HCL 4 MG/2ML IJ SOLN
INTRAMUSCULAR | Status: DC | PRN
  Administered 2013-12-03: 4 mg via INTRAVENOUS

## 2013-12-03 MED ORDER — MIDAZOLAM HCL 2 MG/2ML IJ SOLN
INTRAMUSCULAR | Status: AC
  Filled 2013-12-03: qty 2

## 2013-12-03 MED ORDER — DEXAMETHASONE 6 MG PO TABS
10.0000 mg | ORAL_TABLET | Freq: Every day | ORAL | Status: AC
  Administered 2013-12-04: 10 mg via ORAL
  Filled 2013-12-03: qty 1

## 2013-12-03 MED ORDER — MIDAZOLAM HCL 5 MG/5ML IJ SOLN
INTRAMUSCULAR | Status: DC | PRN
  Administered 2013-12-03: 2 mg via INTRAVENOUS

## 2013-12-03 MED ORDER — HYDROMORPHONE HCL PF 1 MG/ML IJ SOLN: 0.2500 mg | INTRAMUSCULAR | Status: DC | PRN

## 2013-12-03 MED ORDER — ACETAMINOPHEN 500 MG PO TABS
1000.0000 mg | ORAL_TABLET | Freq: Four times a day (QID) | ORAL | Status: AC
  Administered 2013-12-03 – 2013-12-04 (×4): 1000 mg via ORAL
  Filled 2013-12-03 (×6): qty 2

## 2013-12-03 MED ORDER — BUPIVACAINE HCL (PF) 0.25 % IJ SOLN
INTRAMUSCULAR | Status: AC
  Filled 2013-12-03: qty 30

## 2013-12-03 MED ORDER — BISACODYL 10 MG RE SUPP: 10.0000 mg | Freq: Every day | RECTAL | Status: DC | PRN

## 2013-12-03 MED ORDER — DEXAMETHASONE SODIUM PHOSPHATE 10 MG/ML IJ SOLN
10.0000 mg | Freq: Once | INTRAMUSCULAR | Status: AC
  Administered 2013-12-03: 10 mg via INTRAVENOUS

## 2013-12-03 MED ORDER — 0.9 % SODIUM CHLORIDE (POUR BTL) OPTIME
TOPICAL | Status: DC | PRN
  Administered 2013-12-03: 1000 mL

## 2013-12-03 MED ORDER — PROPOFOL INFUSION 10 MG/ML OPTIME
INTRAVENOUS | Status: DC | PRN
  Administered 2013-12-03: 140 ug/kg/min via INTRAVENOUS

## 2013-12-03 MED ORDER — CEFAZOLIN SODIUM-DEXTROSE 2-3 GM-% IV SOLR
2.0000 g | INTRAVENOUS | Status: AC
  Administered 2013-12-03: 2 g via INTRAVENOUS

## 2013-12-03 MED ORDER — ACETAMINOPHEN 650 MG RE SUPP: 650.0000 mg | Freq: Four times a day (QID) | RECTAL | Status: DC | PRN

## 2013-12-03 MED ORDER — METHOCARBAMOL 500 MG PO TABS
500.0000 mg | ORAL_TABLET | Freq: Four times a day (QID) | ORAL | Status: DC | PRN
  Administered 2013-12-04 – 2013-12-06 (×8): 500 mg via ORAL
  Filled 2013-12-03 (×9): qty 1

## 2013-12-03 MED ORDER — METOCLOPRAMIDE HCL 5 MG/ML IJ SOLN: 5.0000 mg | Freq: Three times a day (TID) | INTRAMUSCULAR | Status: DC | PRN

## 2013-12-03 MED ORDER — RIVAROXABAN 10 MG PO TABS
10.0000 mg | ORAL_TABLET | Freq: Every day | ORAL | Status: DC
  Administered 2013-12-04 – 2013-12-06 (×3): 10 mg via ORAL
  Filled 2013-12-03 (×4): qty 1

## 2013-12-03 MED ORDER — SODIUM CHLORIDE 0.9 % IR SOLN
Status: DC | PRN
  Administered 2013-12-03: 1000 mL

## 2013-12-03 MED ORDER — SODIUM CHLORIDE 0.9 % IV SOLN: INTRAVENOUS | Status: DC

## 2013-12-03 MED ORDER — ACETAMINOPHEN 325 MG PO TABS
650.0000 mg | ORAL_TABLET | Freq: Four times a day (QID) | ORAL | Status: DC | PRN
  Administered 2013-12-04: 650 mg via ORAL
  Filled 2013-12-03: qty 2

## 2013-12-03 MED ORDER — SODIUM CHLORIDE 0.9 % IJ SOLN
INTRAMUSCULAR | Status: AC
  Filled 2013-12-03: qty 10

## 2013-12-03 MED ORDER — LACTATED RINGERS IV SOLN
INTRAVENOUS | Status: DC
Start: 2013-12-03 — End: 2013-12-03
  Administered 2013-12-03: 15:00:00 via INTRAVENOUS
  Administered 2013-12-03: 1000 mL via INTRAVENOUS

## 2013-12-03 MED ORDER — FLEET ENEMA 7-19 GM/118ML RE ENEM: 1.0000 | ENEMA | Freq: Once | RECTAL | Status: AC | PRN

## 2013-12-03 MED ORDER — SODIUM CHLORIDE 0.9 % IJ SOLN
INTRAMUSCULAR | Status: DC | PRN
  Administered 2013-12-03: 30 mL

## 2013-12-03 MED ORDER — PHENYLEPHRINE HCL 10 MG/ML IJ SOLN
INTRAMUSCULAR | Status: DC | PRN
  Administered 2013-12-03: 80 ug via INTRAVENOUS
  Administered 2013-12-03 (×4): 40 ug via INTRAVENOUS
  Administered 2013-12-03: 80 ug via INTRAVENOUS
  Administered 2013-12-03: 40 ug via INTRAVENOUS
  Administered 2013-12-03: 80 ug via INTRAVENOUS

## 2013-12-03 MED ORDER — POTASSIUM CHLORIDE IN NACL 20-0.45 MEQ/L-% IV SOLN
INTRAVENOUS | Status: DC
  Administered 2013-12-03: 19:00:00 via INTRAVENOUS
  Filled 2013-12-03 (×8): qty 1000

## 2013-12-03 MED ORDER — LIDOCAINE HCL 1 % IJ SOLN
INTRAMUSCULAR | Status: AC
  Filled 2013-12-03: qty 20

## 2013-12-03 MED ORDER — PROPOFOL 10 MG/ML IV BOLUS
INTRAVENOUS | Status: AC
  Filled 2013-12-03: qty 20

## 2013-12-03 MED ORDER — MEPERIDINE HCL 50 MG/ML IJ SOLN
INTRAMUSCULAR | Status: AC
  Filled 2013-12-03: qty 1

## 2013-12-03 MED ORDER — CEFAZOLIN SODIUM-DEXTROSE 2-3 GM-% IV SOLR
INTRAVENOUS | Status: AC
  Filled 2013-12-03: qty 50

## 2013-12-03 MED ORDER — DIPHENHYDRAMINE HCL 12.5 MG/5ML PO ELIX
12.5000 mg | ORAL_SOLUTION | ORAL | Status: DC | PRN
Start: 2013-12-03 — End: 2013-12-06

## 2013-12-03 MED ORDER — CEFAZOLIN SODIUM-DEXTROSE 2-3 GM-% IV SOLR
2.0000 g | Freq: Four times a day (QID) | INTRAVENOUS | Status: AC
  Administered 2013-12-03 – 2013-12-04 (×2): 2 g via INTRAVENOUS
  Filled 2013-12-03 (×2): qty 50

## 2013-12-03 MED ORDER — EPHEDRINE SULFATE 50 MG/ML IJ SOLN
INTRAMUSCULAR | Status: AC
  Filled 2013-12-03: qty 1

## 2013-12-03 MED ORDER — ONDANSETRON HCL 4 MG/2ML IJ SOLN: 4.0000 mg | Freq: Four times a day (QID) | INTRAMUSCULAR | Status: DC | PRN

## 2013-12-03 MED ORDER — TRAMADOL HCL 50 MG PO TABS
50.0000 mg | ORAL_TABLET | Freq: Four times a day (QID) | ORAL | Status: DC | PRN
  Administered 2013-12-04: 50 mg via ORAL
  Filled 2013-12-03: qty 1

## 2013-12-03 MED ORDER — BUPIVACAINE LIPOSOME 1.3 % IJ SUSP
INTRAMUSCULAR | Status: DC | PRN
  Administered 2013-12-03: 20 mL

## 2013-12-03 MED ORDER — CHLORHEXIDINE GLUCONATE 4 % EX LIQD: 60.0000 mL | Freq: Once | CUTANEOUS | Status: DC

## 2013-12-03 MED ORDER — SODIUM CHLORIDE 0.9 % IJ SOLN
INTRAMUSCULAR | Status: AC
  Filled 2013-12-03: qty 50

## 2013-12-03 MED ORDER — ACETAMINOPHEN 10 MG/ML IV SOLN
1000.0000 mg | Freq: Once | INTRAVENOUS | Status: AC
  Administered 2013-12-03: 1000 mg via INTRAVENOUS
  Filled 2013-12-03: qty 100

## 2013-12-03 MED ORDER — MENTHOL 3 MG MT LOZG
1.0000 | LOZENGE | OROMUCOSAL | Status: DC | PRN
  Filled 2013-12-03: qty 9

## 2013-12-03 MED ORDER — KETOROLAC TROMETHAMINE 15 MG/ML IJ SOLN
7.5000 mg | Freq: Four times a day (QID) | INTRAMUSCULAR | Status: AC | PRN
  Administered 2013-12-03 – 2013-12-04 (×2): 7.5 mg via INTRAVENOUS
  Filled 2013-12-03 (×2): qty 1

## 2013-12-03 MED ORDER — LACTATED RINGERS IV SOLN: INTRAVENOUS | Status: DC

## 2013-12-03 MED ORDER — DEXAMETHASONE SODIUM PHOSPHATE 10 MG/ML IJ SOLN
10.0000 mg | Freq: Every day | INTRAMUSCULAR | Status: AC
  Filled 2013-12-03: qty 1

## 2013-12-03 MED ORDER — LIDOCAINE HCL (CARDIAC) 20 MG/ML IV SOLN
INTRAVENOUS | Status: AC
  Filled 2013-12-03: qty 5

## 2013-12-03 MED ORDER — ONDANSETRON HCL 4 MG PO TABS: 4.0000 mg | ORAL_TABLET | Freq: Four times a day (QID) | ORAL | Status: DC | PRN

## 2013-12-03 MED ORDER — FENTANYL CITRATE 0.05 MG/ML IJ SOLN
INTRAMUSCULAR | Status: DC | PRN
  Administered 2013-12-03 (×2): 25 ug via INTRAVENOUS
  Administered 2013-12-03: 50 ug via INTRAVENOUS

## 2013-12-03 MED ORDER — TRANEXAMIC ACID 100 MG/ML IV SOLN
1000.0000 mg | INTRAVENOUS | Status: AC
  Administered 2013-12-03: 1000 mg via INTRAVENOUS
  Filled 2013-12-03 (×2): qty 10

## 2013-12-03 MED ORDER — PHENOL 1.4 % MT LIQD
1.0000 | OROMUCOSAL | Status: DC | PRN
  Filled 2013-12-03: qty 177

## 2013-12-03 MED ORDER — METHOCARBAMOL 1000 MG/10ML IJ SOLN
500.0000 mg | Freq: Four times a day (QID) | INTRAVENOUS | Status: DC | PRN
  Administered 2013-12-03: 500 mg via INTRAVENOUS
  Filled 2013-12-03: qty 5

## 2013-12-03 MED ORDER — OXYCODONE HCL 5 MG PO TABS
5.0000 mg | ORAL_TABLET | ORAL | Status: DC | PRN
  Administered 2013-12-03 – 2013-12-06 (×20): 10 mg via ORAL
  Filled 2013-12-03 (×20): qty 2

## 2013-12-03 MED ORDER — PANTOPRAZOLE SODIUM 40 MG PO TBEC
40.0000 mg | DELAYED_RELEASE_TABLET | Freq: Every day | ORAL | Status: DC
  Administered 2013-12-04 – 2013-12-06 (×3): 40 mg via ORAL
  Filled 2013-12-03 (×3): qty 1

## 2013-12-03 MED ORDER — PHENYLEPHRINE 40 MCG/ML (10ML) SYRINGE FOR IV PUSH (FOR BLOOD PRESSURE SUPPORT)
PREFILLED_SYRINGE | INTRAVENOUS | Status: AC
  Filled 2013-12-03: qty 10

## 2013-12-03 MED ORDER — FENTANYL CITRATE 0.05 MG/ML IJ SOLN
INTRAMUSCULAR | Status: AC
  Filled 2013-12-03: qty 2

## 2013-12-03 MED ORDER — ALPRAZOLAM 0.5 MG PO TABS
0.5000 mg | ORAL_TABLET | Freq: Every evening | ORAL | Status: DC | PRN
  Administered 2013-12-04: 0.5 mg via ORAL
  Filled 2013-12-03: qty 1

## 2013-12-03 MED ORDER — MORPHINE SULFATE 2 MG/ML IJ SOLN
1.0000 mg | INTRAMUSCULAR | Status: DC | PRN
  Administered 2013-12-05: 1 mg via INTRAVENOUS
  Filled 2013-12-03: qty 1

## 2013-12-03 MED ORDER — MEPERIDINE HCL 50 MG/ML IJ SOLN
12.5000 mg | Freq: Once | INTRAMUSCULAR | Status: AC
  Administered 2013-12-03: 12.5 mg via INTRAVENOUS

## 2013-12-03 MED ORDER — BUPIVACAINE HCL 0.25 % IJ SOLN
INTRAMUSCULAR | Status: DC | PRN
  Administered 2013-12-03: 20 mL

## 2013-12-03 MED ORDER — PRAMIPEXOLE DIHYDROCHLORIDE 0.25 MG PO TABS
0.2500 mg | ORAL_TABLET | Freq: Every day | ORAL | Status: DC
  Administered 2013-12-04: 0.25 mg via ORAL
  Administered 2013-12-05: 0.5 mg via ORAL
  Filled 2013-12-03 (×4): qty 3

## 2013-12-03 MED ORDER — DOCUSATE SODIUM 100 MG PO CAPS
100.0000 mg | ORAL_CAPSULE | Freq: Two times a day (BID) | ORAL | Status: DC
  Administered 2013-12-03 – 2013-12-06 (×6): 100 mg via ORAL

## 2013-12-03 MED ORDER — FENTANYL CITRATE 0.05 MG/ML IJ SOLN
INTRAMUSCULAR | Status: AC
  Filled 2013-12-03: qty 5

## 2013-12-03 MED ORDER — BUPIVACAINE LIPOSOME 1.3 % IJ SUSP
20.0000 mL | Freq: Once | INTRAMUSCULAR | Status: DC
  Filled 2013-12-03: qty 20

## 2013-12-03 MED ORDER — DEXAMETHASONE SODIUM PHOSPHATE 10 MG/ML IJ SOLN
INTRAMUSCULAR | Status: AC
  Filled 2013-12-03: qty 1

## 2013-12-03 MED ORDER — MIRABEGRON ER 50 MG PO TB24
50.0000 mg | ORAL_TABLET | Freq: Every morning | ORAL | Status: DC
  Administered 2013-12-04 – 2013-12-06 (×3): 50 mg via ORAL
  Filled 2013-12-03 (×3): qty 1

## 2013-12-03 MED ORDER — METOCLOPRAMIDE HCL 10 MG PO TABS
5.0000 mg | ORAL_TABLET | Freq: Three times a day (TID) | ORAL | Status: DC | PRN
  Administered 2013-12-04: 10 mg via ORAL
  Filled 2013-12-03: qty 1

## 2013-12-03 MED ORDER — LIDOCAINE HCL (CARDIAC) 20 MG/ML IV SOLN
INTRAVENOUS | Status: DC | PRN
  Administered 2013-12-03: 50 mg via INTRAVENOUS

## 2013-12-03 MED ORDER — POLYETHYLENE GLYCOL 3350 17 G PO PACK
17.0000 g | PACK | Freq: Every day | ORAL | Status: DC | PRN
  Administered 2013-12-05: 17 g via ORAL

## 2013-12-03 MED ORDER — BUPIVACAINE HCL (PF) 0.75 % IJ SOLN
INTRAMUSCULAR | Status: DC | PRN
Start: 2013-12-03 — End: 2013-12-03
  Administered 2013-12-03: 15 mg via INTRATHECAL

## 2013-12-03 SURGICAL SUPPLY — 56 items
BAG ZIPLOCK 12X15 (MISCELLANEOUS) ×3 IMPLANT
BANDAGE ELASTIC 6 VELCRO ST LF (GAUZE/BANDAGES/DRESSINGS) ×3 IMPLANT
BANDAGE ESMARK 6X9 LF (GAUZE/BANDAGES/DRESSINGS) ×1 IMPLANT
BLADE SAG 18X100X1.27 (BLADE) ×3 IMPLANT
BLADE SAW SGTL 11.0X1.19X90.0M (BLADE) ×3 IMPLANT
BNDG ESMARK 6X9 LF (GAUZE/BANDAGES/DRESSINGS) ×3
BOWL SMART MIX CTS (DISPOSABLE) ×3 IMPLANT
CAPT RP KNEE ×3 IMPLANT
CEMENT HV SMART SET (Cement) ×6 IMPLANT
CLOSURE WOUND 1/2 X4 (GAUZE/BANDAGES/DRESSINGS) ×2
CUFF TOURN SGL QUICK 34 (TOURNIQUET CUFF) ×2
CUFF TRNQT CYL 34X4X40X1 (TOURNIQUET CUFF) ×1 IMPLANT
DECANTER SPIKE VIAL GLASS SM (MISCELLANEOUS) ×3 IMPLANT
DRAPE EXTREMITY T 121X128X90 (DRAPE) ×3 IMPLANT
DRAPE POUCH INSTRU U-SHP 10X18 (DRAPES) ×3 IMPLANT
DRAPE U-SHAPE 47X51 STRL (DRAPES) ×3 IMPLANT
DRSG ADAPTIC 3X8 NADH LF (GAUZE/BANDAGES/DRESSINGS) ×3 IMPLANT
DRSG PAD ABDOMINAL 8X10 ST (GAUZE/BANDAGES/DRESSINGS) ×3 IMPLANT
DURAPREP 26ML APPLICATOR (WOUND CARE) ×3 IMPLANT
ELECT REM PT RETURN 9FT ADLT (ELECTROSURGICAL) ×3
ELECTRODE REM PT RTRN 9FT ADLT (ELECTROSURGICAL) ×1 IMPLANT
EVACUATOR 1/8 PVC DRAIN (DRAIN) ×3 IMPLANT
FACESHIELD WRAPAROUND (MASK) ×15 IMPLANT
GLOVE BIO SURGEON STRL SZ8 (GLOVE) ×3 IMPLANT
GLOVE BIOGEL PI IND STRL 8 (GLOVE) ×2 IMPLANT
GLOVE BIOGEL PI INDICATOR 8 (GLOVE) ×4
GLOVE ECLIPSE 8.0 STRL XLNG CF (GLOVE) ×3 IMPLANT
GLOVE SURG SS PI 7.0 STRL IVOR (GLOVE) ×6 IMPLANT
GOWN STRL REUS W/TWL LRG LVL3 (GOWN DISPOSABLE) ×6 IMPLANT
GOWN STRL REUS W/TWL XL LVL3 (GOWN DISPOSABLE) ×6 IMPLANT
HANDPIECE INTERPULSE COAX TIP (DISPOSABLE) ×2
IMMOBILIZER KNEE 20 (SOFTGOODS) ×6 IMPLANT
IMMOBILIZER KNEE 20 THIGH 36 (SOFTGOODS) ×1 IMPLANT
KIT BASIN OR (CUSTOM PROCEDURE TRAY) ×3 IMPLANT
MANIFOLD NEPTUNE II (INSTRUMENTS) ×3 IMPLANT
NDL SAFETY ECLIPSE 18X1.5 (NEEDLE) ×2 IMPLANT
NEEDLE HYPO 18GX1.5 SHARP (NEEDLE) ×4
NS IRRIG 1000ML POUR BTL (IV SOLUTION) ×3 IMPLANT
PACK TOTAL JOINT (CUSTOM PROCEDURE TRAY) ×3 IMPLANT
PADDING CAST COTTON 6X4 STRL (CAST SUPPLIES) ×9 IMPLANT
POSITIONER SURGICAL ARM (MISCELLANEOUS) ×3 IMPLANT
SET HNDPC FAN SPRY TIP SCT (DISPOSABLE) ×1 IMPLANT
SPONGE GAUZE 4X4 12PLY (GAUZE/BANDAGES/DRESSINGS) ×3 IMPLANT
STRIP CLOSURE SKIN 1/2X4 (GAUZE/BANDAGES/DRESSINGS) ×4 IMPLANT
SUCTION FRAZIER 12FR DISP (SUCTIONS) ×3 IMPLANT
SUT MNCRL AB 4-0 PS2 18 (SUTURE) ×3 IMPLANT
SUT VIC AB 2-0 CT1 27 (SUTURE) ×6
SUT VIC AB 2-0 CT1 TAPERPNT 27 (SUTURE) ×3 IMPLANT
SUT VLOC 180 0 24IN GS25 (SUTURE) ×3 IMPLANT
SYR 20CC LL (SYRINGE) ×3 IMPLANT
SYR 50ML LL SCALE MARK (SYRINGE) ×3 IMPLANT
TOWEL OR 17X26 10 PK STRL BLUE (TOWEL DISPOSABLE) ×3 IMPLANT
TOWEL OR NON WOVEN STRL DISP B (DISPOSABLE) ×3 IMPLANT
TRAY FOLEY CATH 14FRSI W/METER (CATHETERS) ×3 IMPLANT
WATER STERILE IRR 1500ML POUR (IV SOLUTION) ×3 IMPLANT
WRAP KNEE MAXI GEL POST OP (GAUZE/BANDAGES/DRESSINGS) ×3 IMPLANT

## 2013-12-03 NOTE — H&P (View-Only) (Signed)
Alyssa Campbell DOB: [birth year] Married / Language: English / Race: White Female Date of Admission:  12/03/2013 Chief Complaint:  Left Knee Pain History of Present Illness The patient is a 68 year old female who comes in for a preoperative History and Physical. The patient is scheduled for a left total knee arthroplasty to be performed by Dr. Gus Rankin. Aluisio, MD at [facility] on 12-03-2013. The patient reports left knee symptoms including: pain which began several month(s) ago without any known injury. The patient describes the severity of the symptoms as moderate in severity. The patient describes their pain as aching. Symptoms are reported to be located in the left knee. She is having alot of pain behind the knee, that radiates down the calf, as well as instability. She did not have an injury but was standing on her feet for several hours in November putting up her Christmas.  She has significant arthritis found to be in the knee and would like to proceed with surgery. She has done well with the right knee replacement and now wishes to proceed with the left knee replacement. They have been treated conservatively in the past for the above stated problem and despite conservative measures, they continue to have progressive pain and severe functional limitations and dysfunction. They have failed non-operative management including home exercise, medications. It is felt that they would benefit from undergoing total joint replacement. Risks and benefits of the procedure have been discussed with the patient and they elect to proceed with surgery. There are no active contraindications to surgery such as ongoing infection or rapidly progressive neurological disease.  Allergies Erythromycin *MACROLIDES*. Rash. Band-Aid *MEDICAL DEVICES*. Adhesive - NOT LATEX ALLERGY. SHE IS ABLE TO USE PAPER TAPE.   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 mother Osteoarthritis. grandmother mothers side Chronic Obstructive Lung Disease. Mother. mother Cancer. First Degree Relatives. father    Social History Tobacco / smoke exposure. no Drug/Alcohol Rehab (Currently). no Post-Surgical Plans. [facility] in [place], [place] Previously in rehab. no Alcohol use. current drinker; drinks hard liquor; only occasionally per week Living situation. live with spouse Illicit drug use. no Marital status. married Pain Contract. no Exercise. Exercises rarely Number of flights of stairs before winded. 1 Current work status. retired Merchant [employer] officer. Living Will, Healthcare POA Tobacco use. Never smoker. never smoker Children. 0 Most recent primary occupation. Retired Programmer, systems, current Education officer, museum   Medication History Myrbetriq (50MG  Tablet ER 24HR, Oral) Active. Xanax (0.5MG  Tablet, Oral) Active. (prn) Benazepril-Hydrochlorothiazide (10-12.5MG  Tablet, Oral) Active. Mirapex (0.25MG  Tablet, Oral) Active. Lansoprazole (30MG  Capsule DR, Oral) Active. Probiotic ( Oral) Active. Vitamin B12 TR ( Oral) Specific dose unknown - Active. CoQ10 (100MG  Capsule, Oral) Active. Garcinia Cambogia-Chromium (500-200MG -MCG Tablet, Oral) Active.   Past Surgical History Total Hip Replacement. bilateral Other Surgery. Belly button hernia Tubal Ligation Cardiac Cath. Date: 2006. Gallbladder Surgery. laporoscopic; Umbilical Hernia Repair Total Knee Replacement - Right. Date: 11/2012.   Medical History High blood pressure Osteoarthritis Hypercholesterolemia Diverticulosis Restless Leg Syndrome Urinary Incontinence Gastroesophageal Reflux Disease Overactive Bladder    Review of Systems General:Not Present- Chills, Fever, Night Sweats, Appetite Loss, Fatigue, Feeling sick, Weight Gain and Weight Loss. Skin:Not Present- Itching, Rash, Skin Color Changes,  Ulcer, Psoriasis and Change in Hair or Nails. HEENT:Not Present- Sensitivity to light, Nose Bleed, Visual Loss, Decreased Hearing and Ringing in the Ears. Neck:Not Present- Swollen Glands and Neck Mass.  Respiratory:Not Present- Shortness of breath, Snoring, Chronic Cough and Bloody sputum. Cardiovascular:Not Present- Shortness of Breath, Chest Pain, Swelling of Extremities, Leg Cramps and Palpitations. Gastrointestinal:Present- Heartburn. Not Present- Bloody Stool, Abdominal Pain, Vomiting, Nausea and Incontinence of Stool. Female Genitourinary:Present- Incontinence. Not Present- Blood in Urine, Irregular/missing periods, Frequency and Nocturia. Musculoskeletal:Present- Joint Pain. Not Present- Muscle Weakness, Muscle Pain, Joint Stiffness, Joint Swelling and Back Pain. Neurological:Not Present- Tingling, Numbness, Burning, Tremor, Headaches and Dizziness. Psychiatric:Not Present- Anxiety, Depression and Memory Loss. Endocrine:Not Present- Cold Intolerance, Heat Intolerance, Excessive hunger and Excessive Thirst. Hematology:Not Present- Abnormal Bleeding, Abnormal bruising, Anemia and Blood Clots.    Vitals Weight: 211 lb Height: 66 in Weight was reported by patient. Height was reported by patient. Body Surface Area: 2.11 m Body Mass Index: 34.06 kg/m Pulse: 84 (Regular) Resp.: 16 (Unlabored) BP: 108/58 (Sitting, Right Arm, Standard)   Physical Exam The physical exam findings are as follows:  Note: Patient is a 68 year old female with continued knee pain. Patient is accompanied today by her husband.   General Mental Status - Alert, cooperative and good historian. General Appearance- pleasant. Not in acute distress. Orientation- Oriented X3. Build & Nutrition- Well nourished and Well developed.   Head and Neck Head- normocephalic, atraumatic . Neck Global Assessment- supple. no bruit auscultated on the right and no bruit auscultated on the  left.   Eye Pupil- Bilateral- Regular and Round. Motion- Bilateral- EOMI.   Chest and Lung Exam Auscultation: Breath sounds:- clear at anterior chest wall and - clear at posterior chest wall. Adventitious sounds:- No Adventitious sounds.   Cardiovascular Auscultation:Rhythm- Regular rate and rhythm. Heart Sounds- S1 WNL and S2 WNL. Murmurs & Other Heart Sounds:Auscultation of the heart reveals - No Murmurs.   Abdomen Palpation/Percussion:Tenderness- Abdomen is non-tender to palpation. Rigidity (guarding)- Abdomen is soft. Auscultation:Auscultation of the abdomen reveals - Bowel sounds normal.   Female Genitourinary Not done, not pertinent to present illness  Musculoskeletal Well developed female in no distress. Her left knee shows slight effusion. Her range of motion is about 5 to 125. Slight varus in the knee. Marked crepitus on range of motion. Tenderness medial and lateral with no instability noted. Pulses, sensation and motor are intact both lower extremities.  RADIOGRAPHS: AP both knees and lateral of both show the prosthesis on the right in excellent position with no periprosthetic abnormalities. On the left she has bone on bone arthritis in the medial and patellofemoral compartments.   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 Lequita HaltAluisio.  [place]  PCP - Dr. Melvyn NethLewis - Patient has been seen preoperatively and felt to be stable for surgery.  The patient does not have any contraindications and will receive TXA (tranexamic acid) prior to surgery.  Signed electronically by Lauraine RinneAlexzandrew L Perkins, III PA-C

## 2013-12-03 NOTE — Interval H&P Note (Signed)
History and Physical Interval Note:  12/03/2013 11:58 AM  Alyssa Campbell  has presented today for surgery, with the diagnosis of OA LEFT KNEE  The various methods of treatment have been discussed with the patient and family. After consideration of risks, benefits and other options for treatment, the patient has consented to  Procedure(s): LEFT TOTAL KNEE ARTHROPLASTY (Left) as a surgical intervention .  The patient's history has been reviewed, patient examined, no change in status, stable for surgery.  I have reviewed the patient's chart and labs.  Questions were answered to the patient's satisfaction.     Homero Fellers [name] V

## 2013-12-03 NOTE — Progress Notes (Signed)
Shaking and shivering stopped. 

## 2013-12-03 NOTE — Progress Notes (Signed)
Demerol 12.5mg  IVP given for shaking and shivering.

## 2013-12-03 NOTE — Anesthesia Preprocedure Evaluation (Addendum)
Anesthesia Evaluation  Patient identified by MRN, date of birth, ID band Patient awake    Reviewed: Allergy & Precautions, H&P , NPO status , Patient's Chart, lab work & pertinent test results  Airway Mallampati: II TM Distance: >3 FB Neck ROM: full    Dental no notable dental hx. (+) Teeth Intact, Dental Advisory Given   Pulmonary neg pulmonary ROS,  breath sounds clear to auscultation  Pulmonary exam normal       Cardiovascular hypertension, Pt. on medications Rhythm:regular Rate:Normal     Neuro/Psych negative neurological ROS  negative psych ROS   GI/Hepatic negative GI ROS, Neg liver ROS, GERD-  Medicated and Controlled,  Endo/Other  negative endocrine ROS  Renal/GU negative Renal ROS  negative genitourinary   Musculoskeletal   Abdominal   Peds  Hematology negative hematology ROS (+)   Anesthesia Other Findings   Reproductive/Obstetrics negative OB ROS                         Anesthesia Physical Anesthesia Plan  ASA: II  Anesthesia Plan: Spinal   Post-op Pain Management:    Induction:   Airway Management Planned: Simple Face Mask  Additional Equipment:   Intra-op Plan:   Post-operative Plan:   Informed Consent: I have reviewed the patients History and Physical, chart, labs and discussed the procedure including the risks, benefits and alternatives for the proposed anesthesia with the patient or authorized representative who has indicated his/her understanding and acceptance.   Dental Advisory Given  Plan Discussed with: CRNA and Surgeon  Anesthesia Plan Comments:         Anesthesia Quick Evaluation

## 2013-12-03 NOTE — Anesthesia Procedure Notes (Signed)
Spinal  Patient location during procedure: OR Start time: 12/03/2013 1:50 PM End time: 12/03/2013 2:00 PM Staffing Anesthesiologist: Rod Mae L Performed by: anesthesiologist  Preanesthetic Checklist Completed: patient identified, site marked, surgical consent, pre-op evaluation, timeout performed, IV checked, risks and benefits discussed and monitors and equipment checked Spinal Block Patient position: sitting Prep: Betadine Patient monitoring: heart rate, continuous pulse ox and blood pressure Approach: midline Location: L3-4 Injection technique: single-shot Needle Needle type: Spinocan and Whitacre  Needle gauge: 25 G Needle length: 9 cm Assessment Sensory level: T6 Additional Notes Expiration date of kit checked and confirmed. Patient tolerated procedure well, without complications.

## 2013-12-03 NOTE — Progress Notes (Signed)
Utilization review completed.  

## 2013-12-03 NOTE — Op Note (Signed)
Pre-operative diagnosis- Osteoarthritis  Left knee(s)  Post-operative diagnosis- Osteoarthritis Left knee(s)  Procedure-  Left  Total Knee Arthroplasty  Surgeon- Gus Rankin. [name], MD  Assistant- Leilani Able, PA-C   Anesthesia-  Spinal  EBL-* No blood loss amount entered *   Drains Hemovac  Tourniquet time-  Total Tourniquet Time Documented: Thigh (Left) - 34 minutes Total: Thigh (Left) - 34 minutes     Complications- None  Condition-PACU - hemodynamically stable.   Brief Clinical Note  Alyssa Campbell is a 68 y.o. 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 She has constant pain, with activity and at rest and significant functional deficits with difficulties even with ADLs. She has had extensive non-op management including analgesics, injections of cortisone, and home exercise program, but remains in significant pain with significant dysfunction. Radiographs show bone on bone arthritis medial and patellofemoral. She presents now for left Total Knee Arthroplasty.    Procedure in detail---   The patient is brought into the operating room and positioned supine on the operating table. After successful administration of  Spinal,   a tourniquet is placed high on the  Left thigh(s) and the lower extremity is prepped and draped in the usual sterile fashion. Time out is performed by the operating team and then the  Left lower extremity is wrapped in Esmarch, knee flexed and the tourniquet inflated to 300 mmHg.       A midline incision is made with a ten blade through the subcutaneous tissue to the level of the extensor mechanism. A fresh blade is used to make a medial parapatellar arthrotomy. Soft tissue over the proximal medial tibia is subperiosteally elevated to the joint line with a knife and into the semimembranosus bursa with a Cobb elevator. Soft tissue over the proximal lateral tibia is elevated with attention being paid to  avoiding the patellar tendon on the tibial tubercle. The patella is everted, knee flexed 90 degrees and the ACL and PCL are removed. Findings are bone on bone all 3 compartments with large global osteophytes.        The drill is used to create a starting hole in the distal femur and the canal is thoroughly irrigated with sterile saline to remove the fatty contents. The 5 degree Left  valgus alignment guide is placed into the femoral canal and the distal femoral cutting block is pinned to remove 10 mm off the distal femur. Resection is made with an oscillating saw.      The tibia is subluxed forward and the menisci are removed. The extramedullary alignment guide is placed referencing proximally at the medial aspect of the tibial tubercle and distally along the second metatarsal axis and tibial crest. The block is pinned to remove 49mm off the more deficient medial  side. Resection is made with an oscillating saw. Size 3is the most appropriate size for the tibia and the proximal tibia is prepared with the modular drill and keel punch for that size.      The femoral sizing guide is placed and size 4 is most appropriate. Rotation is marked off the epicondylar axis and confirmed by creating a rectangular flexion gap at 90 degrees. The size 4 cutting block is pinned in this rotation and the anterior, posterior and chamfer cuts are made with the oscillating saw. The intercondylar block is then placed and that cut is made.      Trial size 3 tibial component, trial size 4  narrow posterior stabilized femur and a 12.5  mm posterior stabilized rotating platform insert trial is placed. Full extension is achieved with excellent varus/valgus and anterior/posterior balance throughout full range of motion. The patella is everted and thickness measured to be 22  mm. Free hand resection is taken to 12 mm, a 35 template is placed, lug holes are drilled, trial patella is placed, and it tracks normally. Osteophytes are removed off the  posterior femur with the trial in place. All trials are removed and the cut bone surfaces prepared with pulsatile lavage. Cement is mixed and once ready for implantation, the size 3 tibial implant, size  4 narrow posterior stabilized femoral component, and the size 35 patella are cemented in place and the patella is held with the clamp. The trial insert is placed and the knee held in full extension. The Exparel (20 ml mixed with 30 ml saline) and .25% Bupivicaine, are injected into the extensor mechanism, posterior capsule, medial and lateral gutters and subcutaneous tissues.  All extruded cement is removed and once the cement is hard the permanent 12.5 mm posterior stabilized rotating platform insert is placed into the tibial tray.      The wound is copiously irrigated with saline solution and the extensor mechanism closed over a hemovac drain with #1 V-loc suture. The tourniquet is released for a total tourniquet time of 34  minutes. Flexion against gravity is 140 degrees and the patella tracks normally. Subcutaneous tissue is closed with 2.0 vicryl and subcuticular with running 4.0 Monocryl. The incision is cleaned and dried and steri-strips and a bulky sterile dressing are applied. The limb is placed into a knee immobilizer and the patient is awakened and transported to recovery in stable condition.      Please note that a surgical assistant was a medical necessity for this procedure in order to perform it in a safe and expeditious manner. Surgical assistant was necessary to retract the ligaments and vital neurovascular structures to prevent injury to them and also necessary for proper positioning of the limb to allow for anatomic placement of the prosthesis.   Gus RankinFrank V. Laster Appling, MD    12/03/2013, 2:58 PM

## 2013-12-03 NOTE — Anesthesia Postprocedure Evaluation (Signed)
  Anesthesia Post-op Note  Patient: Public affairs consultant  Procedure(s) Performed: Procedure(s) (LRB): LEFT TOTAL KNEE ARTHROPLASTY (Left)  Patient Location: PACU  Anesthesia Type: Spinal  Level of Consciousness: awake and alert   Airway and Oxygen Therapy: Patient Spontanous Breathing  Post-op Pain: mild  Post-op Assessment: Post-op Vital signs reviewed, Patient's Cardiovascular Status Stable, Respiratory Function Stable, Patent Airway and No signs of Nausea or vomiting  Last Vitals:  Filed Vitals:   12/03/13 1026  BP: 134/89  Pulse: 86  Temp: 36.7 C  Resp: 18    Post-op Vital Signs: stable   Complications: No apparent anesthesia complications

## 2013-12-03 NOTE — Transfer of Care (Signed)
Immediate Anesthesia Transfer of Care Note  Patient: Alyssa Campbell  Procedure(s) Performed: Procedure(s): LEFT TOTAL KNEE ARTHROPLASTY (Left)  Patient Location: PACU  Anesthesia Type:Spinal  Level of Consciousness: awake, alert , oriented and patient cooperative  Airway & Oxygen Therapy: Patient Spontanous Breathing and Patient connected to face mask oxygen  Post-op Assessment: Report given to PACU RN and Post -op Vital signs reviewed and stable  Post vital signs: Reviewed and stable  Complications: No apparent anesthesia complications

## 2013-12-04 LAB — CBC
HCT: 36.5 % (ref 36.0–46.0)
Hemoglobin: 11.8 g/dL — ABNORMAL LOW (ref 12.0–15.0)
MCH: 30.3 pg (ref 26.0–34.0)
MCHC: 32.3 g/dL (ref 30.0–36.0)
MCV: 93.8 fL (ref 78.0–100.0)
PLATELETS: 214 10*3/uL (ref 150–400)
RBC: 3.89 MIL/uL (ref 3.87–5.11)
RDW: 13.3 % (ref 11.5–15.5)
WBC: 13.6 10*3/uL — AB (ref 4.0–10.5)

## 2013-12-04 LAB — BASIC METABOLIC PANEL
BUN: 14 mg/dL (ref 6–23)
CHLORIDE: 103 meq/L (ref 96–112)
CO2: 26 mEq/L (ref 19–32)
Calcium: 8.7 mg/dL (ref 8.4–10.5)
Creatinine, Ser: 0.74 mg/dL (ref 0.50–1.10)
GFR calc non Af Amer: 87 mL/min — ABNORMAL LOW (ref >90)
Glucose, Bld: 167 mg/dL — ABNORMAL HIGH (ref 70–99)
Potassium: 4.2 mEq/L (ref 3.7–5.3)
SODIUM: 139 meq/L (ref 137–147)

## 2013-12-04 MED ORDER — METHOCARBAMOL 500 MG PO TABS: 500.0000 mg | ORAL_TABLET | Freq: Four times a day (QID) | ORAL | Status: AC | PRN

## 2013-12-04 MED ORDER — OXYCODONE HCL 5 MG PO TABS
5.0000 mg | ORAL_TABLET | ORAL | Status: AC | PRN
Start: 2013-12-04 — End: ?

## 2013-12-04 MED ORDER — DSS 100 MG PO CAPS: 100.0000 mg | ORAL_CAPSULE | Freq: Two times a day (BID) | ORAL | Status: AC

## 2013-12-04 MED ORDER — TRAMADOL HCL 50 MG PO TABS: 50.0000 mg | ORAL_TABLET | Freq: Four times a day (QID) | ORAL | Status: AC | PRN

## 2013-12-04 MED ORDER — RIVAROXABAN 10 MG PO TABS: 10.0000 mg | ORAL_TABLET | Freq: Every day | ORAL | Status: AC

## 2013-12-04 NOTE — Progress Notes (Signed)
OT Cancellation Note  Patient Details Name: Alyssa Campbell MRN: [id number] DOB: 08/22/1945   Cancelled Treatment:    Reason Eval/Treat Not Completed: Other (comment)  Pt is Medicare/Medicaid and current D/C plan is SNF. No apparent immediate acute care OT needs, therefore will defer OT to SNF. If OT eval is needed please call Acute Rehab Dept. at [phone]   [facility] 12/04/2013, 9:42 AM Marica Otter, OTR/L [phone] 12/04/2013

## 2013-12-04 NOTE — Progress Notes (Signed)
Physical Therapy Treatment Patient Details Name: Alyssa Campbell MRN: [id number] DOB: 1945-12-23 Today's Date: 12/04/2013    History of Present Illness 68 yo female s/p L TKA 12/03/13.     PT Comments    Progressing with mobility.  Follow Up Recommendations  SNF     Equipment Recommendations  None recommended by PT    Recommendations for Other Services       Precautions / Restrictions Precautions Precautions: Knee;Fall Required Braces or Orthoses: Knee Immobilizer - Left Knee Immobilizer - Left: Discontinue once straight leg raise with < 10 degree lag Restrictions Weight Bearing Restrictions: No LLE Weight Bearing: Weight bearing as tolerated    Mobility  Bed Mobility Overal bed mobility: Needs Assistance Bed Mobility: Sit to Supine     Supine to sit: Min assist Sit to supine: Min assist   General bed mobility comments: Assist for L LE.   Transfers Overall transfer level: Needs assistance Equipment used: Rolling walker (2 wheeled) Transfers: Sit to/from Stand Sit to Stand: Min assist         General transfer comment: VCS safety, technique, hand placement. Assist to rise, stabilize, control descent  Ambulation/Gait Ambulation/Gait assistance: Min guard Ambulation Distance (Feet): 125 Feet Assistive device: Rolling walker (2 wheeled) Gait Pattern/deviations: Step-to pattern;Antalgic;Decreased stride length     General Gait Details: slow gait speed. VCs safety, technique, sequence. close guard for safety   Stairs            Wheelchair Mobility    Modified Rankin (Stroke Patients Only)       Balance                                    Cognition Arousal/Alertness: Awake/alert Behavior During Therapy: WFL for tasks assessed/performed Overall Cognitive Status: Within Functional Limits for tasks assessed                      Exercises Total Joint Exercises Ankle Circles/Pumps: AROM;Both;10 reps;Supine Quad Sets:  AROM;Both;10 reps;Supine Heel Slides: AAROM;Left;10 reps;Supine Hip ABduction/ADduction: AAROM;Left;10 reps;Supine Straight Leg Raises: AAROM;Left;10 reps;Supine Goniometric ROM: 10-45 degrees    General Comments        Pertinent Vitals/Pain 4/10 L knee with activity. Ice applied end of session    Home Living Family/patient expects to be discharged to:: Skilled nursing facility Living Arrangements: Spouse/significant other Available Help at Discharge: Skilled Nursing Facility Type of Home: House Home Access: Stairs to enter Entrance Stairs-Rails: Right Home Layout: Multi-level;Able to live on main level with bedroom/bathroom Home Equipment: Dan Humphreys - 2 wheels;Wheelchair - manual;Cane - single point;Bedside commode      Prior Function Level of Independence: Independent          PT Goals (current goals can now be found in the care plan section) Acute Rehab PT Goals Patient Stated Goal: rehab to regain independence PT Goal Formulation: With patient/family Time For Goal Achievement: 12/18/13 Potential to Achieve Goals: Good Progress towards PT goals: Progressing toward goals    Frequency  7X/week    PT Plan Current plan remains appropriate    Co-evaluation             End of Session Equipment Utilized During Treatment: Gait belt Activity Tolerance: Patient tolerated treatment well Patient left: in bed;with call bell/phone within reach     Time: 1410-1434 PT Time Calculation (min): 24 min  Charges:  $Gait Training: 8-22 mins $Therapeutic Exercise: 8-22 mins  G Codes:      Weston Anna, MPT Pager: [phone]

## 2013-12-04 NOTE — Progress Notes (Signed)
   Subjective: 1 Day Post-Op Procedure(s) (LRB): LEFT TOTAL KNEE ARTHROPLASTY (Left) Patient reports pain as moderate.   Patient seen in rounds with Dr. Lequita Halt. Patient is well, and has had no acute complaints or problems. Reports little rest last night but doing well. No issues overnight. No SOB or chest pain  We will start therapy today.  Plan is to go Skilled nursing facility after hospital stay.  Objective: Vital signs in last 24 hours: Temp:  [97.5 F (36.4 C)-98.6 F (37 C)] 98.2 F (36.8 C) (06/09 0517) Pulse Rate:  [57-88] 71 (06/09 0517) Resp:  [12-20] 16 (06/09 0517) BP: (95-134)/(53-89) 102/65 mmHg (06/09 0517) SpO2:  [91 %-100 %] 96 % (06/09 0517) Weight:  [96.163 kg (212 lb)] 96.163 kg (212 lb) (06/08 1717)  Intake/Output from previous day:  Intake/Output Summary (Last 24 hours) at 12/04/13 0809 Last data filed at 12/04/13 0700  Gross per 24 hour  Intake 3547.5 ml  Output   1415 ml  Net 2132.5 ml    Intake/Output this shift:    Labs:  Recent Labs  12/04/13 0438  HGB 11.8*    Recent Labs  12/04/13 0438  WBC 13.6*  RBC 3.89  HCT 36.5  PLT 214    Recent Labs  12/04/13 0438  NA 139  K 4.2  CL 103  CO2 26  BUN 14  CREATININE 0.74  GLUCOSE 167*  CALCIUM 8.7   No results found for this basename: LABPT, INR,  in the last 72 hours  EXAM General - Patient is Alert and Oriented Extremity - Neurologically intact Intact pulses distally Dorsiflexion/Plantar flexion intact Compartment soft Dressing - dressing C/D/I Motor Function - intact, moving foot and toes well on exam.  Hemovac pulled without difficulty.  Past Medical History  Diagnosis Date  . Hypertension   . GERD (gastroesophageal reflux disease)   . Diverticulosis     no recent flares  . Restless leg syndrome   . Urinary incontinence     controlled with meds  . Arthritis   . Complication of anesthesia 2006    bp dropped  after left hip replacment, had general  . Heart  murmur     as a child     Assessment/Plan: 1 Day Post-Op Procedure(s) (LRB): LEFT TOTAL KNEE ARTHROPLASTY (Left) Active Problems:   OA (osteoarthritis) of knee  Estimated body mass index is 34.23 kg/(m^2) as calculated from the following:   Height as of this encounter: 5\' 6"  (1.676 m).   Weight as of this encounter: 96.163 kg (212 lb). Advance diet Up with therapy D/C IV fluids when tolerating POs well  DVT Prophylaxis - Xarelto Weight-Bearing as tolerated D/C O2 and Pulse OX and try on Room [employer] PT today. Plan for DC to SNF Thursday. Wants [facility] in [place].   Dimitri Ped, PA-C Orthopaedic Surgery 12/04/2013, 8:09 AM

## 2013-12-04 NOTE — Evaluation (Signed)
Physical Therapy Evaluation Patient Details Name: Alyssa Campbell MRN: [id number] DOB: 1946-01-17 Today's Date: 12/04/2013   History of Present Illness  68 yo female s/p L TKA 12/03/13.   Clinical Impression  On eval, pt required Min assist for mobility-able to ambulate ~60 feet with rolling walker. Plan is for ST rehab at SNF.     Follow Up Recommendations SNF    Equipment Recommendations  None recommended by PT    Recommendations for Other Services       Precautions / Restrictions Precautions Precautions: Knee;Fall Required Braces or Orthoses: Knee Immobilizer - Left Knee Immobilizer - Left: Discontinue once straight leg raise with < 10 degree lag Restrictions Weight Bearing Restrictions: No LLE Weight Bearing: Weight bearing as tolerated      Mobility  Bed Mobility Overal bed mobility: Needs Assistance Bed Mobility: Supine to Sit     Supine to sit: Min assist     General bed mobility comments: Assist for L LE.   Transfers Overall transfer level: Needs assistance Equipment used: Rolling walker (2 wheeled) Transfers: Sit to/from Stand Sit to Stand: Min assist         General transfer comment: VCS safety, technique, hand placement. Assist to rise, stabilize, control descent  Ambulation/Gait Ambulation/Gait assistance: Min assist Ambulation Distance (Feet): 60 Feet Assistive device: Rolling walker (2 wheeled) Gait Pattern/deviations: Decreased stride length;Step-to pattern;Antalgic     General Gait Details: slow gait speed. VCs safety, technique, sequence.   Stairs            Wheelchair Mobility    Modified Rankin (Stroke Patients Only)       Balance                                             Pertinent Vitals/Pain 5/10 L knee with activity. Ice applied end of session    Home Living Family/patient expects to be discharged to:: Skilled nursing facility Living Arrangements: Spouse/significant other Available Help at  Discharge: Skilled Nursing Facility Type of Home: House Home Access: Stairs to enter Entrance Stairs-Rails: Right Entrance Stairs-Number of Steps: 3 Home Layout: Multi-level;Able to live on main level with bedroom/bathroom Home Equipment: Dan Humphreys - 2 wheels;Wheelchair - manual;Cane - single point;Bedside commode      Prior Function Level of Independence: Independent               Hand Dominance        Extremity/Trunk Assessment   Upper Extremity Assessment: Overall WFL for tasks assessed           Lower Extremity Assessment: LLE deficits/detail   LLE Deficits / Details: hip flex 2/5, hip abd/add 2/5, moves ankle well  Cervical / Trunk Assessment: Normal  Communication   Communication: No difficulties  Cognition Arousal/Alertness: Awake/alert Behavior During Therapy: WFL for tasks assessed/performed Overall Cognitive Status: Within Functional Limits for tasks assessed                      General Comments      Exercises        Assessment/Plan    PT Assessment Patient needs continued PT services  PT Diagnosis Difficulty walking;Acute pain   PT Problem List Decreased strength;Decreased range of motion;Decreased activity tolerance;Decreased balance;Decreased mobility;Pain;Decreased knowledge of use of DME;Decreased knowledge of precautions  PT Treatment Interventions DME instruction;Gait training;Functional mobility training;Therapeutic activities;Therapeutic exercise;Patient/family education;Balance training  PT Goals (Current goals can be found in the Care Plan section) Acute Rehab PT Goals Patient Stated Goal: rehab to regain independence PT Goal Formulation: With patient/family Time For Goal Achievement: 12/18/13 Potential to Achieve Goals: Good    Frequency 7X/week   Barriers to discharge        Co-evaluation               End of Session Equipment Utilized During Treatment: Gait belt Activity Tolerance: Patient tolerated  treatment well Patient left: in chair;with call bell/phone within reach;with family/visitor present           Time: 1025-1039 PT Time Calculation (min): 14 min   Charges:   PT Evaluation $Initial PT Evaluation Tier I: 1 Procedure PT Treatments $Gait Training: 8-22 mins   PT G Codes:          Rebeca AlertJannie Kyleena Scheirer, MPT Pager: [phone]

## 2013-12-04 NOTE — Progress Notes (Signed)
Clinical Social Work Department BRIEF PSYCHOSOCIAL ASSESSMENT 12/04/2013  Patient:  Alyssa Campbell, Alyssa Campbell     Account Number:  [id number]     Admit date:  12/03/2013  Clinical Social Worker:  Earlie Server  Date/Time:  12/04/2013 02:30 PM  Referred by:  Physician  Date Referred:  12/04/2013 Referred for  SNF Placement   Other Referral:   Interview type:  Patient Other interview type:    PSYCHOSOCIAL DATA Living Status:  FAMILY Admitted from facility:   Level of care:   Primary support name:  Artis Delay Primary support relationship to patient:  SPOUSE Degree of support available:   Strong    CURRENT CONCERNS Current Concerns  Post-Acute Placement   Other Concerns:    SOCIAL WORK ASSESSMENT / PLAN CSW received referral in order to complete psychosocial assessment. CSW reviewed chart and met with patient and husband at bedside. CSW introduced myself and explained role.    Patient reports that she and husband live in [place] and have been preparing for DC. Patient had surgery in the past and went to [facility] but husband had to drive over 2 hours to visit. Patient reports they have toured [facility] in [place], [place] and have already signed paperwork. Patient aware of DC plans and reports husband will transport her to SNF. Patient reports she knows ambulance ride will not be covered but she will be comfortable in [place] on the way to SNF.    CSW contacted Wells Guiles at [facility] at [phone]. SNF reports they need chest xray, demographics, H & P, MAR, PT/OT notes, and progress notes faxed to them at [phone]. SNF's fax machine is down today but reports it should be fixed tomorrow. SNF does not need FL2 or PASRR. SNF agreeable to keep CSW updated when fax machine is fixed.    CSW will continue to follow.   Assessment/plan status:  Psychosocial Support/Ongoing Assessment of Needs Other assessment/ plan:   Information/referral to community resources:   SNF information    PATIENT'S/FAMILY'S  RESPONSE TO PLAN OF CARE: Patient alert and oriented. Patient reports she is feeling better and that husband has been supportive. Patient reports she feels well knowing that she is well prepared and has a plan in place at DC. Patient thanked CSW for time and agreeable to SNF at DC.       Sindy Messing, LCSW (Coverage for [employer])

## 2013-12-04 NOTE — Progress Notes (Signed)
Clinical Social Work Department CLINICAL SOCIAL WORK PLACEMENT NOTE 12/04/2013  Patient:  Alyssa Campbell, Alyssa Campbell  Account Number:  [id number] Admit date:  12/03/2013  Clinical Social Worker:  Unk Lightning, LCSW  Date/time:  12/04/2013 02:30 PM  Clinical Social Work is seeking post-discharge placement for this patient at the following level of care:   SKILLED NURSING   (*CSW will update this form in Epic as items are completed)   12/04/2013  Patient/family provided with [facility] Department of Clinical Social Work's list of facilities offering this level of care within the geographic area requested by the patient (or if unable, by the patient's family).  12/04/2013  Patient/family informed of their freedom to choose among providers that offer the needed level of care, that participate in Medicare, Medicaid or managed care program needed by the patient, have an available bed and are willing to accept the patient.  12/04/2013  Patient/family informed of [facility]' ownership interest in [facility], as well as of the fact that they are under no obligation to receive care at this facility.  PASARR submitted to EDS on SNF reports not needed since [place] facility PASARR number received on   FL2 transmitted to all facilities in geographic area requested by pt/family on   FL2 transmitted to all facilities within larger geographic area on   Patient informed that his/her managed care company has contracts with or will negotiate with  certain facilities, including the following:     Patient/family informed of bed offers received:   Patient chooses bed at  Physician recommends and patient chooses bed at    Patient to be transferred to  on   Patient to be transferred to facility by  Patient and family notified of transfer on  Name of family member notified:    The following physician request were entered in Epic:   Additional Comments:

## 2013-12-04 NOTE — Discharge Instructions (Addendum)
° °Dr. Makeisha Jentsch °Total Joint Specialist °[facility] °[address] °[place], [place] [address] °[phone] ° °TOTAL KNEE REPLACEMENT POSTOPERATIVE DIRECTIONS ° ° ° °Knee Rehabilitation, Guidelines Following Surgery  °Results after knee surgery are often greatly improved when you follow the exercise, range of motion and muscle strengthening exercises prescribed by your doctor. Safety measures are also important to protect the knee from further injury. Any time any of these exercises cause you to have increased pain or swelling in your knee joint, decrease the amount until you are comfortable again and slowly increase them. If you have problems or questions, call your caregiver or physical therapist for advice.  ° °HOME CARE INSTRUCTIONS  °Remove items at home which could result in a fall. This includes throw rugs or furniture in walking pathways.  °Continue medications as instructed at time of discharge. °You may have some home medications which will be placed on hold until you complete the course of blood thinner medication.  °You may start showering once you are discharged home but do not submerge the incision under water. Just pat the incision dry and apply a dry gauze dressing on daily. °Walk with walker as instructed.  °You may resume a sexual relationship in one month or when given the OK by  your doctor.  °· Use walker as long as suggested by your caregivers. °· Avoid periods of inactivity such as sitting longer than an hour when not asleep. This helps prevent blood clots.  °You may put full weight on your legs and walk as much as is comfortable.  °You may return to work once you are cleared by your doctor.  °Do not drive a car for 6 weeks or until released by you surgeon.  °· Do not drive while taking narcotics.  °Wear the elastic stockings for three weeks following surgery during the day but you may remove then at night. °Make sure you keep all of your appointments after your  operation with all of your doctors and caregivers. You should call the office at the above phone number and make an appointment for approximately two weeks after the date of your surgery. °Change the dressing daily and reapply a dry dressing each time. °Please pick up a stool softener and laxative for home use as long as you are requiring pain medications. °· Continue to use ice on the knee for pain and swelling from surgery. You may notice swelling that will progress down to the foot and ankle.  This is normal after surgery.  Elevate the leg when you are not up walking on it.   °It is important for you to complete the blood thinner medication as prescribed by your doctor. °· Continue to use the breathing machine which will help keep your temperature down.  It is common for your temperature to cycle up and down following surgery, especially at night when you are not up moving around and exerting yourself.  The breathing machine keeps your lungs expanded and your temperature down. ° °RANGE OF MOTION AND STRENGTHENING EXERCISES  °Rehabilitation of the knee is important following a knee injury or an operation. After just a few days of immobilization, the muscles of the thigh which control the knee become weakened and shrink (atrophy). Knee exercises are designed to build up the tone and strength of the thigh muscles and to improve knee motion. Often times heat used for twenty to thirty minutes before working out will loosen up your tissues and help with improving the   range of motion but do not use heat for the first two weeks following surgery. These exercises can be done on a training (exercise) mat, on the floor, on a table or on a bed. Use what ever works the best and is most comfortable for you Knee exercises include:  °Leg Lifts - While your knee is still immobilized in a splint or cast, you can do straight leg raises. Lift the leg to 60 degrees, hold for 3 sec, and slowly lower the leg. Repeat 10-20 times 2-3  times daily. Perform this exercise against resistance later as your knee gets better.  °Quad and Hamstring Sets - Tighten up the muscle on the front of the thigh (Quad) and hold for 5-10 sec. Repeat this 10-20 times hourly. Hamstring sets are done by pushing the foot backward against an object and holding for 5-10 sec. Repeat as with quad sets.  °A rehabilitation program following serious knee injuries can speed recovery and prevent re-injury in the future due to weakened muscles. Contact your doctor or a physical therapist for more information on knee rehabilitation.  ° °SKILLED REHAB INSTRUCTIONS: °If the patient is transferred to a skilled rehab facility following release from the hospital, a list of the current medications will be sent to the facility for the patient to continue.  When discharged from the skilled rehab facility, please have the facility set up the patient's Home Health Physical Therapy prior to being released. Also, the skilled facility will be responsible for providing the patient with their medications at time of release from the facility to include their pain medication, the muscle relaxants, and their blood thinner medication. If the patient is still at the rehab facility at time of the two week follow up appointment, the skilled rehab facility will also need to assist the patient in arranging follow up appointment in our office and any transportation needs. ° °MAKE SURE YOU:  °Understand these instructions.  °Will watch your condition.  °Will get help right away if you are not doing well or get worse.  ° ° °Pick up stool softner and laxative for home. °Do not submerge incision under water. °May shower. °Continue to use ice for pain and swelling from surgery. ° ° ° ° ° ° ° ° ° ° ° ° °Information on my medicine - XARELTO® (Rivaroxaban) ° °This medication education was reviewed with me or my healthcare representative as part of my discharge preparation.  The pharmacist that spoke with me  during my hospital stay was:  Justin Marshall Legge, RPH ° °Why was Xarelto® prescribed for you? °Xarelto® was prescribed for you to reduce the risk of blood clots forming after orthopedic surgery. The medical term for these abnormal blood clots is venous thromboembolism (VTE). ° °What do you need to know about xarelto® ? °Take your Xarelto® ONCE DAILY at the same time every day. °You may take it either with or without food. ° °If you have difficulty swallowing the tablet whole, you may crush it and mix in applesauce just prior to taking your dose. ° °Take Xarelto® exactly as prescribed by your doctor and DO NOT stop taking Xarelto® without talking to the doctor who prescribed the medication.  Stopping without other VTE prevention medication to take the place of Xarelto® may increase your risk of developing a clot. ° °After discharge, you should have regular check-up appointments with your healthcare provider that is prescribing your Xarelto®.   ° °What do you do if you miss a dose? °If you   miss a dose, take it as soon as you remember on the same day then continue your regularly scheduled once daily regimen the next day. Do not take two doses of Xarelto® on the same day.  ° °Important Safety Information °A possible side effect of Xarelto® is bleeding. You should call your healthcare provider right away if you experience any of the following: °  Bleeding from an injury or your nose that does not stop. °  Unusual colored urine (red or dark brown) or unusual colored stools (red or black). °  Unusual bruising for unknown reasons. °  A serious fall or if you hit your head (even if there is no bleeding). ° °Some medicines may interact with Xarelto® and might increase your risk of bleeding while on Xarelto®. To help avoid this, consult your healthcare provider or pharmacist prior to using any new prescription or non-prescription medications, including herbals, vitamins, non-steroidal anti-inflammatory drugs (NSAIDs) and  supplements. ° °This website has more information on Xarelto®: www.xarelto.com. ° ° ° °

## 2013-12-05 LAB — CBC
HEMATOCRIT: 34.3 % — AB (ref 36.0–46.0)
HEMOGLOBIN: 11.1 g/dL — AB (ref 12.0–15.0)
MCH: 30.7 pg (ref 26.0–34.0)
MCHC: 32.4 g/dL (ref 30.0–36.0)
MCV: 95 fL (ref 78.0–100.0)
Platelets: 218 10*3/uL (ref 150–400)
RBC: 3.61 MIL/uL — AB (ref 3.87–5.11)
RDW: 13.6 % (ref 11.5–15.5)
WBC: 16.5 10*3/uL — AB (ref 4.0–10.5)

## 2013-12-05 LAB — BASIC METABOLIC PANEL
BUN: 19 mg/dL (ref 6–23)
CHLORIDE: 105 meq/L (ref 96–112)
CO2: 25 meq/L (ref 19–32)
Calcium: 9 mg/dL (ref 8.4–10.5)
Creatinine, Ser: 0.88 mg/dL (ref 0.50–1.10)
GFR calc Af Amer: 78 mL/min — ABNORMAL LOW (ref >90)
GFR calc non Af Amer: 67 mL/min — ABNORMAL LOW (ref >90)
GLUCOSE: 184 mg/dL — AB (ref 70–99)
POTASSIUM: 4.2 meq/L (ref 3.7–5.3)
Sodium: 143 mEq/L (ref 137–147)

## 2013-12-05 NOTE — Progress Notes (Signed)
Physical Therapy Treatment Patient Details Name: Alyssa Campbell MRN: [id number] DOB: [birth year] Today's Date: 12/05/2013    History of Present Illness 68 yo female s/p L TKA 12/03/13.     PT Comments    Progressing with mobility.   Follow Up Recommendations  SNF     Equipment Recommendations  None recommended by PT    Recommendations for Other Services       Precautions / Restrictions Precautions Precautions: Knee;Fall Required Braces or Orthoses: Knee Immobilizer - Left Knee Immobilizer - Left: Discontinue once straight leg raise with < 10 degree lag Restrictions Weight Bearing Restrictions: No LLE Weight Bearing: Weight bearing as tolerated    Mobility  Bed Mobility Overal bed mobility: Needs Assistance Bed Mobility: Supine to Sit;Sit to Supine     Supine to sit: Min guard Sit to supine: Min guard   General bed mobility comments: close guard for safety  Transfers Overall transfer level: Needs assistance Equipment used: Rolling walker (2 wheeled) Transfers: Sit to/from Stand Sit to Stand: Min guard         General transfer comment: close guard for safety  Ambulation/Gait Ambulation/Gait assistance: Min guard Ambulation Distance (Feet): 225 Feet Assistive device: Rolling walker (2 wheeled) Gait Pattern/deviations: Step-through pattern;Decreased stride length;Antalgic     General Gait Details: close guard for safety   Stairs            Wheelchair Mobility    Modified Rankin (Stroke Patients Only)       Balance                                    Cognition Arousal/Alertness: Awake/alert Behavior During Therapy: WFL for tasks assessed/performed Overall Cognitive Status: Within Functional Limits for tasks assessed                      Exercises Total Joint Exercises Ankle Circles/Pumps: AROM;Both;10 reps;Supine Quad Sets: AROM;Both;10 reps;Supine Heel Slides: AAROM;Left;10 reps;Supine Hip ABduction/ADduction:  AAROM;Left;10 reps;Supine Straight Leg Raises: AROM;Left;10 reps;Supine Goniometric ROM: 10-70 degrees    General Comments        Pertinent Vitals/Pain 5/10 L knee with activity. Ice applied end of session    Home Living                      Prior Function            PT Goals (current goals can now be found in the care plan section) Progress towards PT goals: Progressing toward goals    Frequency  Min 6X/week    PT Plan Current plan remains appropriate    Co-evaluation             End of Session   Activity Tolerance: Patient tolerated treatment well Patient left: with call bell/phone within reach     Time: 1357-1414 PT Time Calculation (min): 17 min  Charges:  $Gait Training: 8-22 mins $Therapeutic Exercise: 8-22 mins                    G Codes:      Rebeca Alert, MPT Pager: [phone]

## 2013-12-05 NOTE — Progress Notes (Signed)
   Subjective: 2 Days Post-Op Procedure(s) (LRB): LEFT TOTAL KNEE ARTHROPLASTY (Left) Patient reports pain as moderate.   Patient seen in rounds with Dr. Lequita Halt. Patient is well, and has had no acute complaints or problems. She reports that she was able to get some sleep last night. Pain under good control. No issues overnight. No SOB or chest pain.  Plan is to go Skilled nursing facility after hospital stay.  Objective: Vital signs in last 24 hours: Temp:  [97.7 F (36.5 C)-98.4 F (36.9 C)] 98.4 F (36.9 C) (06/10 0618) Pulse Rate:  [64-83] 80 (06/10 0618) Resp:  [12-17] 17 (06/10 0618) BP: (96-138)/(68-75) 138/72 mmHg (06/10 0618) SpO2:  [92 %-99 %] 92 % (06/10 0618)  Intake/Output from previous day:  Intake/Output Summary (Last 24 hours) at 12/05/13 0808 Last data filed at 12/05/13 0629  Gross per 24 hour  Intake   1234 ml  Output    600 ml  Net    634 ml    Intake/Output this shift:    Labs:  Recent Labs  12/04/13 0438 12/05/13 0445  HGB 11.8* 11.1*    Recent Labs  12/04/13 0438 12/05/13 0445  WBC 13.6* 16.5*  RBC 3.89 3.61*  HCT 36.5 34.3*  PLT 214 218    Recent Labs  12/04/13 0438 12/05/13 0445  NA 139 143  K 4.2 4.2  CL 103 105  CO2 26 25  BUN 14 19  CREATININE 0.74 0.88  GLUCOSE 167* 184*  CALCIUM 8.7 9.0    EXAM General - Patient is Alert and Oriented Extremity - Neurologically intact Intact pulses distally Dorsiflexion/Plantar flexion intact Compartment soft Dressing/Incision - clean, dry, no drainage Motor Function - intact, moving foot and toes well on exam.   Past Medical History  Diagnosis Date  . Hypertension   . GERD (gastroesophageal reflux disease)   . Diverticulosis     no recent flares  . Restless leg syndrome   . Urinary incontinence     controlled with meds  . Arthritis   . Complication of anesthesia 2006    bp dropped  after left hip replacment, had general  . Heart murmur     as a child      Assessment/Plan: 2 Days Post-Op Procedure(s) (LRB): LEFT TOTAL KNEE ARTHROPLASTY (Left) Active Problems:   OA (osteoarthritis) of knee  Estimated body mass index is 34.23 kg/(m^2) as calculated from the following:   Height as of this encounter: 5\' 6"  (1.676 m).   Weight as of this encounter: 96.163 kg (212 lb). Advance diet Up with therapy D/C IV fluids Discharge to SNF tomorrow  DVT Prophylaxis - Xarelto Weight-Bearing as tolerated  Will continue PT today. Plan for DC to SNF in [place] tomorrow.   Dimitri Ped, PA-C Orthopaedic Surgery 12/05/2013, 8:08 AM

## 2013-12-05 NOTE — Progress Notes (Signed)
Physical Therapy Treatment Patient Details Name: Alyssa Campbell MRN: [id number] DOB: Jan 21, 1946 Today's Date: 12/05/2013    History of Present Illness 68 yo female s/p L TKA 12/03/13.     PT Comments    Progressing very well with mobility.   Follow Up Recommendations  SNF     Equipment Recommendations  None recommended by PT    Recommendations for Other Services       Precautions / Restrictions Precautions Precautions: Knee;Fall Required Braces or Orthoses: Knee Immobilizer - Left Knee Immobilizer - Left: Discontinue once straight leg raise with < 10 degree lag (DC'd KI 6/10-able to SLR) Restrictions Weight Bearing Restrictions: No LLE Weight Bearing: Weight bearing as tolerated    Mobility  Bed Mobility   Bed Mobility: Supine to Sit     Supine to sit: Min guard     General bed mobility comments: close guard for safety  Transfers Overall transfer level: Needs assistance Equipment used: Rolling walker (2 wheeled) Transfers: Sit to/from Stand Sit to Stand: Min guard         General transfer comment: close guard for safety  Ambulation/Gait Ambulation/Gait assistance: Min guard Ambulation Distance (Feet): 200 Feet Assistive device: Rolling walker (2 wheeled) Gait Pattern/deviations: Step-through pattern;Decreased stride length;Antalgic     General Gait Details: close guard for safety   Stairs            Wheelchair Mobility    Modified Rankin (Stroke Patients Only)       Balance                                    Cognition Arousal/Alertness: Awake/alert Behavior During Therapy: WFL for tasks assessed/performed Overall Cognitive Status: Within Functional Limits for tasks assessed                      Exercises Total Joint Exercises Ankle Circles/Pumps: AROM;Both;10 reps;Supine Quad Sets: AROM;Both;10 reps;Supine Heel Slides: AAROM;Left;10 reps;Supine Hip ABduction/ADduction: AAROM;Left;10 reps;Supine Straight  Leg Raises: AROM;Left;10 reps;Supine Goniometric ROM: 10-70 degrees    General Comments        Pertinent Vitals/Pain 5/10 L knee with activity. Ice applied end of session    Home Living                      Prior Function            PT Goals (current goals can now be found in the care plan section) Progress towards PT goals: Progressing toward goals    Frequency  7X/week    PT Plan Current plan remains appropriate    Co-evaluation             End of Session   Activity Tolerance: Patient tolerated treatment well Patient left: in chair;with call bell/phone within reach;with family/visitor present     Time: 2229-7989 PT Time Calculation (min): 23 min  Charges:  $Gait Training: 8-22 mins $Therapeutic Exercise: 8-22 mins                    G Codes:      Rebeca Alert, MPT Pager: [phone]

## 2013-12-06 LAB — CBC
HEMATOCRIT: 30.1 % — AB (ref 36.0–46.0)
HEMOGLOBIN: 10.2 g/dL — AB (ref 12.0–15.0)
MCH: 32.9 pg (ref 26.0–34.0)
MCHC: 33.9 g/dL (ref 30.0–36.0)
MCV: 97.1 fL (ref 78.0–100.0)
Platelets: 179 10*3/uL (ref 150–400)
RBC: 3.1 MIL/uL — ABNORMAL LOW (ref 3.87–5.11)
RDW: 13.9 % (ref 11.5–15.5)
WBC: 10.3 10*3/uL (ref 4.0–10.5)

## 2013-12-06 NOTE — Progress Notes (Signed)
Clinical Social Work Department CLINICAL SOCIAL WORK PLACEMENT NOTE 12/06/2013  Patient:  Alyssa Campbell, Alyssa Campbell  Account Number:  [id number] Admit date:  12/03/2013  Clinical Social Worker:  Unk Lightning, LCSW  Date/time:  12/04/2013 02:30 PM  Clinical Social Work is seeking post-discharge placement for this patient at the following level of care:   SKILLED NURSING   (*CSW will update this form in Epic as items are completed)   12/04/2013  Patient/family provided with [facility] Department of Clinical Social Work's list of facilities offering this level of care within the geographic area requested by the patient (or if unable, by the patient's family).  12/04/2013  Patient/family informed of their freedom to choose among providers that offer the needed level of care, that participate in Medicare, Medicaid or managed care program needed by the patient, have an available bed and are willing to accept the patient.  12/04/2013  Patient/family informed of [facility]' ownership interest in [facility], as well as of the fact that they are under no obligation to receive care at this facility.  PASARR submitted to EDS on  PASARR number received on   FL2 transmitted to all facilities in geographic area requested by pt/family on  12/04/2013 FL2 transmitted to all facilities within larger geographic area on   Patient informed that his/her managed care company has contracts with or will negotiate with  certain facilities, including the following:     Patient/family informed of bed offers received:  12/04/2013 Patient chooses bed at [facility] Physician recommends and patient chooses bed at    [facility]  on  12/06/2013 Patient to be transferred to facility by CAR Patient and family notified of transfer on 12/06/2013 Name of family member notified:  SPOUSE  The following physician request were entered in Epic:   Additional Comments:  Pt / spouse were  in agreement with d/c to SNF today. PT consulted and felt pt was able to tolerate transport to SNF by car. NSG reviewed d/c summary, avs and scripts. Scripts were included in d/c packet.  Cori Razor LCSW [phone]

## 2013-12-06 NOTE — Progress Notes (Signed)
   Subjective: 3 Days Post-Op Procedure(s) (LRB): LEFT TOTAL KNEE ARTHROPLASTY (Left) Patient reports pain as mild.   Plan is to go Skilled nursing facility after hospital stay.  Objective: Vital signs in last 24 hours: Temp:  [97.5 F (36.4 C)-98.8 F (37.1 C)] 97.5 F (36.4 C) (06/11 0510) Pulse Rate:  [73-90] 90 (06/11 0510) Resp:  [16-18] 16 (06/11 0510) BP: (107-121)/(70-78) 121/78 mmHg (06/11 0510) SpO2:  [90 %-93 %] 90 % (06/11 0510)  Intake/Output from previous day:  Intake/Output Summary (Last 24 hours) at 12/06/13 0732 Last data filed at 12/05/13 1900  Gross per 24 hour  Intake    880 ml  Output    400 ml  Net    480 ml    Intake/Output this shift:    Labs:  Recent Labs  12/04/13 0438 12/05/13 0445 12/06/13 0408  HGB 11.8* 11.1* 10.2*    Recent Labs  12/05/13 0445 12/06/13 0408  WBC 16.5* 10.3  RBC 3.61* 3.10*  HCT 34.3* 30.1*  PLT 218 179    Recent Labs  12/04/13 0438 12/05/13 0445  NA 139 143  K 4.2 4.2  CL 103 105  CO2 26 25  BUN 14 19  CREATININE 0.74 0.88  GLUCOSE 167* 184*  CALCIUM 8.7 9.0   No results found for this basename: LABPT, INR,  in the last 72 hours  EXAM General - Patient is Alert, Appropriate and Oriented Extremity - Neurologically intact Neurovascular intact No cellulitis present Compartment soft Dressing/Incision - clean, dry, no drainage Motor Function - intact, moving foot and toes well on exam.   Past Medical History  Diagnosis Date  . Hypertension   . GERD (gastroesophageal reflux disease)   . Diverticulosis     no recent flares  . Restless leg syndrome   . Urinary incontinence     controlled with meds  . Arthritis   . Complication of anesthesia 2006    bp dropped  after left hip replacment, had general  . Heart murmur     as a child     Assessment/Plan: 3 Days Post-Op Procedure(s) (LRB): LEFT TOTAL KNEE ARTHROPLASTY (Left) Active Problems:   OA (osteoarthritis) of knee   Discharge to  SNF  DVT Prophylaxis - Xarelto Weight-Bearing as tolerated to left leg  Keyden Pavlov V 12/06/2013, 7:32 AM

## 2013-12-06 NOTE — Progress Notes (Signed)
Physical Therapy Treatment Patient Details Name: Alyssa Campbell MRN: [id number] DOB: 16-Apr-1946 Today's Date: 12/06/2013    History of Present Illness 68 yo female s/p L TKA 12/03/13.     PT Comments    Progressing with mobility. Pt reports increased pain and fatigue today during session. Planning to d/c to [facility] later today. Ready from PT standpoint.   Follow Up Recommendations  SNF     Equipment Recommendations  None recommended by PT    Recommendations for Other Services       Precautions / Restrictions Precautions Precautions: Knee;Fall Restrictions Weight Bearing Restrictions: No LLE Weight Bearing: Weight bearing as tolerated    Mobility  Bed Mobility Overal bed mobility: Needs Assistance Bed Mobility: Supine to Sit;Sit to Supine     Supine to sit: Min guard Sit to supine: Min guard   General bed mobility comments: close guard for safety  Transfers Overall transfer level: Needs assistance Equipment used: Rolling walker (2 wheeled) Transfers: Sit to/from Stand Sit to Stand: Min guard         General transfer comment: close guard for safety  Ambulation/Gait Ambulation/Gait assistance: Min guard Ambulation Distance (Feet): 115 Feet Assistive device: Rolling walker (2 wheeled) Gait Pattern/deviations: Step-through pattern;Antalgic     General Gait Details: close guard for safety   Stairs            Wheelchair Mobility    Modified Rankin (Stroke Patients Only)       Balance                                    Cognition                            Exercises Total Joint Exercises Ankle Circles/Pumps: AROM;Both;10 reps;Supine Quad Sets: AROM;Both;10 reps;Supine Heel Slides: AAROM;Left;10 reps;Supine Hip ABduction/ADduction: AAROM;Left;Supine Straight Leg Raises: AROM;Left;10 reps;Supine Goniometric ROM: 10-70 degrees    General Comments        Pertinent Vitals/Pain 5/10 L knee with activity. Ice applied  end of session    Home Living                      Prior Function            PT Goals (current goals can now be found in the care plan section) Progress towards PT goals: Progressing toward goals    Frequency  7X/week    PT Plan Current plan remains appropriate    Co-evaluation             End of Session   Activity Tolerance: Patient limited by pain;Patient limited by fatigue Patient left: in bed;with call bell/phone within reach;with family/visitor present     Time: 0175-1025 PT Time Calculation (min): 18 min  Charges:  $Gait Training: 8-22 mins                    G Codes:      Rebeca Alert, MPT Pager: [phone]

## 2013-12-06 NOTE — Discharge Summary (Signed)
Physician Discharge Summary   Patient ID: Alyssa Campbell MRN: [id number] DOB/AGE: [birth year] 68 y.o.  Admit date: 12/03/2013 Discharge date: 12/06/2013  Primary Diagnosis: Osteoarthritis, left knee   Admission Diagnoses:  Past Medical History  Diagnosis Date  . Hypertension   . GERD (gastroesophageal reflux disease)   . Diverticulosis     no recent flares  . Restless leg syndrome   . Urinary incontinence     controlled with meds  . Arthritis   . Complication of anesthesia 2006    bp dropped  after left hip replacment, had general  . Heart murmur     as a child    Discharge Diagnoses:   Active Problems:   OA (osteoarthritis) of knee  Estimated body mass index is 34.23 kg/(m^2) as calculated from the following:   Height as of this encounter: $RemoveBeforeD'5\' 6"'[id number]$  (1.676 m).   Weight as of this encounter: 96.163 kg (212 lb).  Procedure:  Procedure(s) (LRB): LEFT TOTAL KNEE ARTHROPLASTY (Left)   Consults: None  HPI: The patient reports left knee symptoms including: pain which began several month(s) ago without any known injury. The patient describes the severity of the symptoms as moderate in severity. The patient describes their pain as aching. Symptoms are reported to be located in the left knee. She is having alot of pain behind the knee, that radiates down the calf, as well as instability. She did not have an injury but was standing on her feet for several hours in November putting up her Christmas. She has significant arthritis found to be in the knee and would like to proceed with surgery.  She has done well with the right knee replacement and now wishes to proceed with the left knee replacement.  They have been treated conservatively in the past for the above stated problem and despite conservative measures, they continue to have progressive pain and severe functional limitations and dysfunction. They have failed non-operative management including home exercise, medications. It is felt that  they would benefit from undergoing total joint replacement. Risks and benefits of the procedure have been discussed with the patient and they elect to proceed with surgery. There are no active contraindications to surgery such as ongoing infection or rapidly progressive neurological disease.  Laboratory Data: Admission on 12/03/2013  Component Date Value Ref Range Status  . ABO/RH(D) 12/03/2013 O POS   Final  . Antibody Screen 12/03/2013 NEG   Final  . Sample Expiration 12/03/2013 12/06/2013   Final  . WBC 12/04/2013 13.6* 4.0 - 10.5 K/uL Final  . RBC 12/04/2013 3.89  3.87 - 5.11 MIL/uL Final  . Hemoglobin 12/04/2013 11.8* 12.0 - 15.0 g/dL Final  . HCT 12/04/2013 36.5  36.0 - 46.0 % Final  . MCV 12/04/2013 93.8  78.0 - 100.0 fL Final  . MCH 12/04/2013 30.3  26.0 - 34.0 pg Final  . MCHC 12/04/2013 32.3  30.0 - 36.0 g/dL Final  . RDW 12/04/2013 13.3  11.5 - 15.5 % Final  . Platelets 12/04/2013 214  150 - 400 K/uL Final  . Sodium 12/04/2013 139  137 - 147 mEq/L Final  . Potassium 12/04/2013 4.2  3.7 - 5.3 mEq/L Final  . Chloride 12/04/2013 103  96 - 112 mEq/L Final  . CO2 12/04/2013 26  19 - 32 mEq/L Final  . Glucose, Bld 12/04/2013 167* 70 - 99 mg/dL Final  . BUN 12/04/2013 14  6 - 23 mg/dL Final  . Creatinine, Ser 12/04/2013 0.74  0.50 - 1.10 mg/dL Final  .  Calcium 12/04/2013 8.7  8.4 - 10.5 mg/dL Final  . GFR calc non Af Amer 12/04/2013 87* >90 mL/min Final  . GFR calc Af Amer 12/04/2013 >90  >90 mL/min Final   Comment: (NOTE)                          The eGFR has been calculated using the CKD EPI equation.                          This calculation has not been validated in all clinical situations.                          eGFR's persistently <90 mL/min signify possible Chronic Kidney                          Disease.  . WBC 12/05/2013 16.5* 4.0 - 10.5 K/uL Final  . RBC 12/05/2013 3.61* 3.87 - 5.11 MIL/uL Final  . Hemoglobin 12/05/2013 11.1* 12.0 - 15.0 g/dL Final  . HCT 76/16/0737  34.3* 36.0 - 46.0 % Final  . MCV 12/05/2013 95.0  78.0 - 100.0 fL Final  . MCH 12/05/2013 30.7  26.0 - 34.0 pg Final  . MCHC 12/05/2013 32.4  30.0 - 36.0 g/dL Final  . RDW 10/62/6948 13.6  11.5 - 15.5 % Final  . Platelets 12/05/2013 218  150 - 400 K/uL Final  . Sodium 12/05/2013 143  137 - 147 mEq/L Final  . Potassium 12/05/2013 4.2  3.7 - 5.3 mEq/L Final  . Chloride 12/05/2013 105  96 - 112 mEq/L Final  . CO2 12/05/2013 25  19 - 32 mEq/L Final  . Glucose, Bld 12/05/2013 184* 70 - 99 mg/dL Final  . BUN 54/62/7035 19  6 - 23 mg/dL Final  . Creatinine, Ser 12/05/2013 0.88  0.50 - 1.10 mg/dL Final  . Calcium 00/93/8182 9.0  8.4 - 10.5 mg/dL Final  . GFR calc non Af Amer 12/05/2013 67* >90 mL/min Final  . GFR calc Af Amer 12/05/2013 78* >90 mL/min Final   Comment: (NOTE)                          The eGFR has been calculated using the CKD EPI equation.                          This calculation has not been validated in all clinical situations.                          eGFR's persistently <90 mL/min signify possible Chronic Kidney                          Disease.  . WBC 12/06/2013 10.3  4.0 - 10.5 K/uL Final  . RBC 12/06/2013 3.10* 3.87 - 5.11 MIL/uL Final  . Hemoglobin 12/06/2013 10.2* 12.0 - 15.0 g/dL Final  . HCT 99/37/1696 30.1* 36.0 - 46.0 % Final  . MCV 12/06/2013 97.1  78.0 - 100.0 fL Final  . MCH 12/06/2013 32.9  26.0 - 34.0 pg Final  . MCHC 12/06/2013 33.9  30.0 - 36.0 g/dL Final  . RDW 78/93/8101 13.9  11.5 - 15.5 % Final  . Platelets 12/06/2013 179  150 -  400 K/uL Final  Hospital Outpatient Visit on 11/22/2013  Component Date Value Ref Range Status  . aPTT 11/22/2013 32  24 - 37 seconds Final  . WBC 11/22/2013 7.0  4.0 - 10.5 K/uL Final  . RBC 11/22/2013 4.17  3.87 - 5.11 MIL/uL Final  . Hemoglobin 11/22/2013 12.9  12.0 - 15.0 g/dL Final  . HCT 11/22/2013 39.5  36.0 - 46.0 % Final  . MCV 11/22/2013 94.7  78.0 - 100.0 fL Final  . MCH 11/22/2013 30.9  26.0 - 34.0 pg Final    . MCHC 11/22/2013 32.7  30.0 - 36.0 g/dL Final  . RDW 11/22/2013 13.6  11.5 - 15.5 % Final  . Platelets 11/22/2013 244  150 - 400 K/uL Final  . Sodium 11/22/2013 143  137 - 147 mEq/L Final  . Potassium 11/22/2013 3.5* 3.7 - 5.3 mEq/L Final  . Chloride 11/22/2013 101  96 - 112 mEq/L Final  . CO2 11/22/2013 29  19 - 32 mEq/L Final  . Glucose, Bld 11/22/2013 125* 70 - 99 mg/dL Final  . BUN 11/22/2013 15  6 - 23 mg/dL Final  . Creatinine, Ser 11/22/2013 0.82  0.50 - 1.10 mg/dL Final  . Calcium 11/22/2013 9.9  8.4 - 10.5 mg/dL Final  . Total Protein 11/22/2013 7.1  6.0 - 8.3 g/dL Final  . Albumin 11/22/2013 4.0  3.5 - 5.2 g/dL Final  . AST 11/22/2013 18  0 - 37 U/L Final  . ALT 11/22/2013 23  0 - 35 U/L Final  . Alkaline Phosphatase 11/22/2013 61  39 - 117 U/L Final  . Total Bilirubin 11/22/2013 0.4  0.3 - 1.2 mg/dL Final  . GFR calc non Af Amer 11/22/2013 73* >90 mL/min Final  . GFR calc Af Amer 11/22/2013 85* >90 mL/min Final   Comment: (NOTE)                          The eGFR has been calculated using the CKD EPI equation.                          This calculation has not been validated in all clinical situations.                          eGFR's persistently <90 mL/min signify possible Chronic Kidney                          Disease.  Marland Kitchen Prothrombin Time 11/22/2013 12.7  11.6 - 15.2 seconds Final  . INR 11/22/2013 0.97  0.00 - 1.49 Final  . Color, Urine 11/22/2013 YELLOW  YELLOW Final  . APPearance 11/22/2013 CLEAR  CLEAR Final  . Specific Gravity, Urine 11/22/2013 1.004* 1.005 - 1.030 Final  . pH 11/22/2013 7.0  5.0 - 8.0 Final  . Glucose, UA 11/22/2013 NEGATIVE  NEGATIVE mg/dL Final  . Hgb urine dipstick 11/22/2013 NEGATIVE  NEGATIVE Final  . Bilirubin Urine 11/22/2013 NEGATIVE  NEGATIVE Final  . Ketones, ur 11/22/2013 NEGATIVE  NEGATIVE mg/dL Final  . Protein, ur 11/22/2013 NEGATIVE  NEGATIVE mg/dL Final  . Urobilinogen, UA 11/22/2013 0.2  0.0 - 1.0 mg/dL Final  . Nitrite  11/22/2013 NEGATIVE  NEGATIVE Final  . Leukocytes, UA 11/22/2013 NEGATIVE  NEGATIVE Final   MICROSCOPIC NOT DONE ON URINES WITH NEGATIVE PROTEIN, BLOOD, LEUKOCYTES, NITRITE, OR GLUCOSE <1000 mg/dL.  Marland Kitchen MRSA,  PCR 11/22/2013 NEGATIVE  NEGATIVE Final  . Staphylococcus aureus 11/22/2013 NEGATIVE  NEGATIVE Final   Comment:                                 The Xpert SA Assay (FDA                          approved for NASAL specimens                          in patients over 58 years of age),                          is one component of                          a comprehensive surveillance                          program.  Test performance has                          been validated by [employer] for patients greater                          than or equal to 91 year old.                          It is not intended                          to diagnose infection nor to                          guide or monitor treatment.     X-Rays:Dg Chest 2 View  11/22/2013   CLINICAL DATA:  Preop for left knee surgery, hypertension  EXAM: CHEST  2 VIEW  COMPARISON:  Chest x-ray of 11/28/2012  FINDINGS: No active infiltrate or effusion is seen. Mediastinal contours appear normal. The heart is within upper limits of normal. There are degenerative changes present in the lower thoracic spine.  IMPRESSION: No active cardiopulmonary disease.   Electronically Signed   By: Ivar Drape M.D.   On: 11/22/2013 14:34    EKG: Orders placed during the hospital encounter of 11/22/13  . EKG 12-LEAD  . EKG 12-LEAD     Hospital Course: Alyssa Campbell is a 68 y.o. who was admitted to [facility]. They were brought to the operating room on 12/03/2013 and underwent Procedure(s): LEFT TOTAL KNEE ARTHROPLASTY.  Patient tolerated the procedure well and was later transferred to the recovery room and then to the orthopaedic floor for postoperative care.  They were given PO and IV analgesics for pain  control following their surgery.  They were given 24 hours of postoperative antibiotics of  Anti-infectives   Start     Dose/Rate Route Frequency Ordered Stop   12/03/13 2000  ceFAZolin (ANCEF) IVPB 2 g/50 mL premix  2 g 100 mL/hr over 30 Minutes Intravenous Every 6 hours 12/03/13 1837 12/04/13 0302   12/03/13 1029  ceFAZolin (ANCEF) IVPB 2 g/50 mL premix     2 g 100 mL/hr over 30 Minutes Intravenous On call to O.R. 12/03/13 1029 12/03/13 1345     and started on DVT prophylaxis in the form of Xarelto.   PT and OT were ordered for total joint protocol.  Discharge planning consulted to help with postop disposition and equipment needs.  Patient had a fair night on the evening of surgery.  They started to get up OOB with therapy on day one. Hemovac drain was pulled without difficulty.  Continued to work with therapy into day two.  Dressing was changed on day two and the incision was clean and dry.  By day three, the patient had progressed with therapy and meeting their goals.  Incision was healing well.  Patient was seen in rounds and was ready to go to SNF.   Diet: Regular diet Activity:WBAT Follow-up:in 2 weeks Disposition - Skilled nursing facility Discharged Condition: stable   Discharge Instructions   Call MD / Call 911    Complete by:  As directed   If you experience chest pain or shortness of breath, CALL 911 and be transported to the hospital emergency room.  If you develope a fever above 101 F, pus (white drainage) or increased drainage or redness at the wound, or calf pain, call your surgeon's office.     Change dressing    Complete by:  As directed   Change dressing daily with sterile 4 x 4 inch gauze dressing and apply TED hose.     Constipation Prevention    Complete by:  As directed   Drink plenty of fluids.  Prune juice may be helpful.  You may use a stool softener, such as Colace (over the counter) 100 mg twice a day.  Use MiraLax (over the counter) for constipation as  needed.     Diet - low sodium heart healthy    Complete by:  As directed      Discharge instructions    Complete by:  As directed   Walk with your walker. Weight bearing as tolerated Change your dressing daily. Shower only, no tub bath. Call if any temperatures greater than 101 or any wound complications: [phone]     Do not put a pillow under the knee. Place it under the heel.    Complete by:  As directed      Driving restrictions    Complete by:  As directed   No driving     Increase activity slowly as tolerated    Complete by:  As directed      TED hose    Complete by:  As directed   Use stockings (TED hose) for 3 weeks on  both leg(s).  You may remove them at night for sleeping.            Medication List    STOP taking these medications       Co Q 10 100 MG Caps     Garcinia Cambogia-Chromium 500-200 MG-MCG Tabs     Melatonin 10 MG Tabs     Vitamin B-12 CR 1500 MCG Tbcr      TAKE these medications       acetaminophen 500 MG tablet  Commonly known as:  TYLENOL  Take 1,000 mg by mouth every 6 (six) hours as needed for moderate pain.  ALPRAZolam 0.5 MG tablet  Commonly known as:  XANAX  Take 0.5 mg by mouth at bedtime as needed for sleep.     benazepril-hydrochlorthiazide 10-12.5 MG per tablet  Commonly known as:  LOTENSIN HCT  Take 1 tablet by mouth every morning.     DSS 100 MG Caps  Take 100 mg by mouth 2 (two) times daily.     FIBER SELECT GUMMIES PO  Take 2 tablets by mouth daily.     lansoprazole 30 MG capsule  Commonly known as:  PREVACID  Take 30 mg by mouth daily.     methocarbamol 500 MG tablet  Commonly known as:  ROBAXIN  Take 1 tablet (500 mg total) by mouth every 6 (six) hours as needed for muscle spasms.     MYRBETRIQ 50 MG Tb24 tablet  Generic drug:  mirabegron ER  Take 50 mg by mouth every morning.     oxyCODONE 5 MG immediate release tablet  Commonly known as:  Oxy IR/ROXICODONE  Take 1-2 tablets (5-10 mg total) by mouth  every 3 (three) hours as needed for breakthrough pain.     pramipexole 0.25 MG tablet  Commonly known as:  MIRAPEX  Take 0.25-0.75 mg by mouth at bedtime.     rivaroxaban 10 MG Tabs tablet  Commonly known as:  XARELTO  Take 1 tablet (10 mg total) by mouth daily with breakfast.     rosuvastatin 5 MG tablet  Commonly known as:  CRESTOR  Take 5 mg by mouth daily at 6 PM.     traMADol 50 MG tablet  Commonly known as:  ULTRAM  Take 1-2 tablets (50-100 mg total) by mouth every 6 (six) hours as needed for moderate pain.           Follow-up Information   Follow up with Gearlean Alf, MD. Schedule an appointment as soon as possible for a visit in 2 weeks.   Specialty:  Orthopedic Surgery   Contact information:   [address] [place] [address] [phone]       Signed: Ardeen Jourdain, PA-C Orthopaedic Surgery 12/06/2013, 7:57 AM

## 2014-09-27 IMAGING — CR DG CHEST 2V
2 series · 2 of 2 positions shown · non-contrast
Comparison: None.

CLINICAL DATA: Preop for right total knee replacement

CHEST - 2 VIEW

[w chest pa]
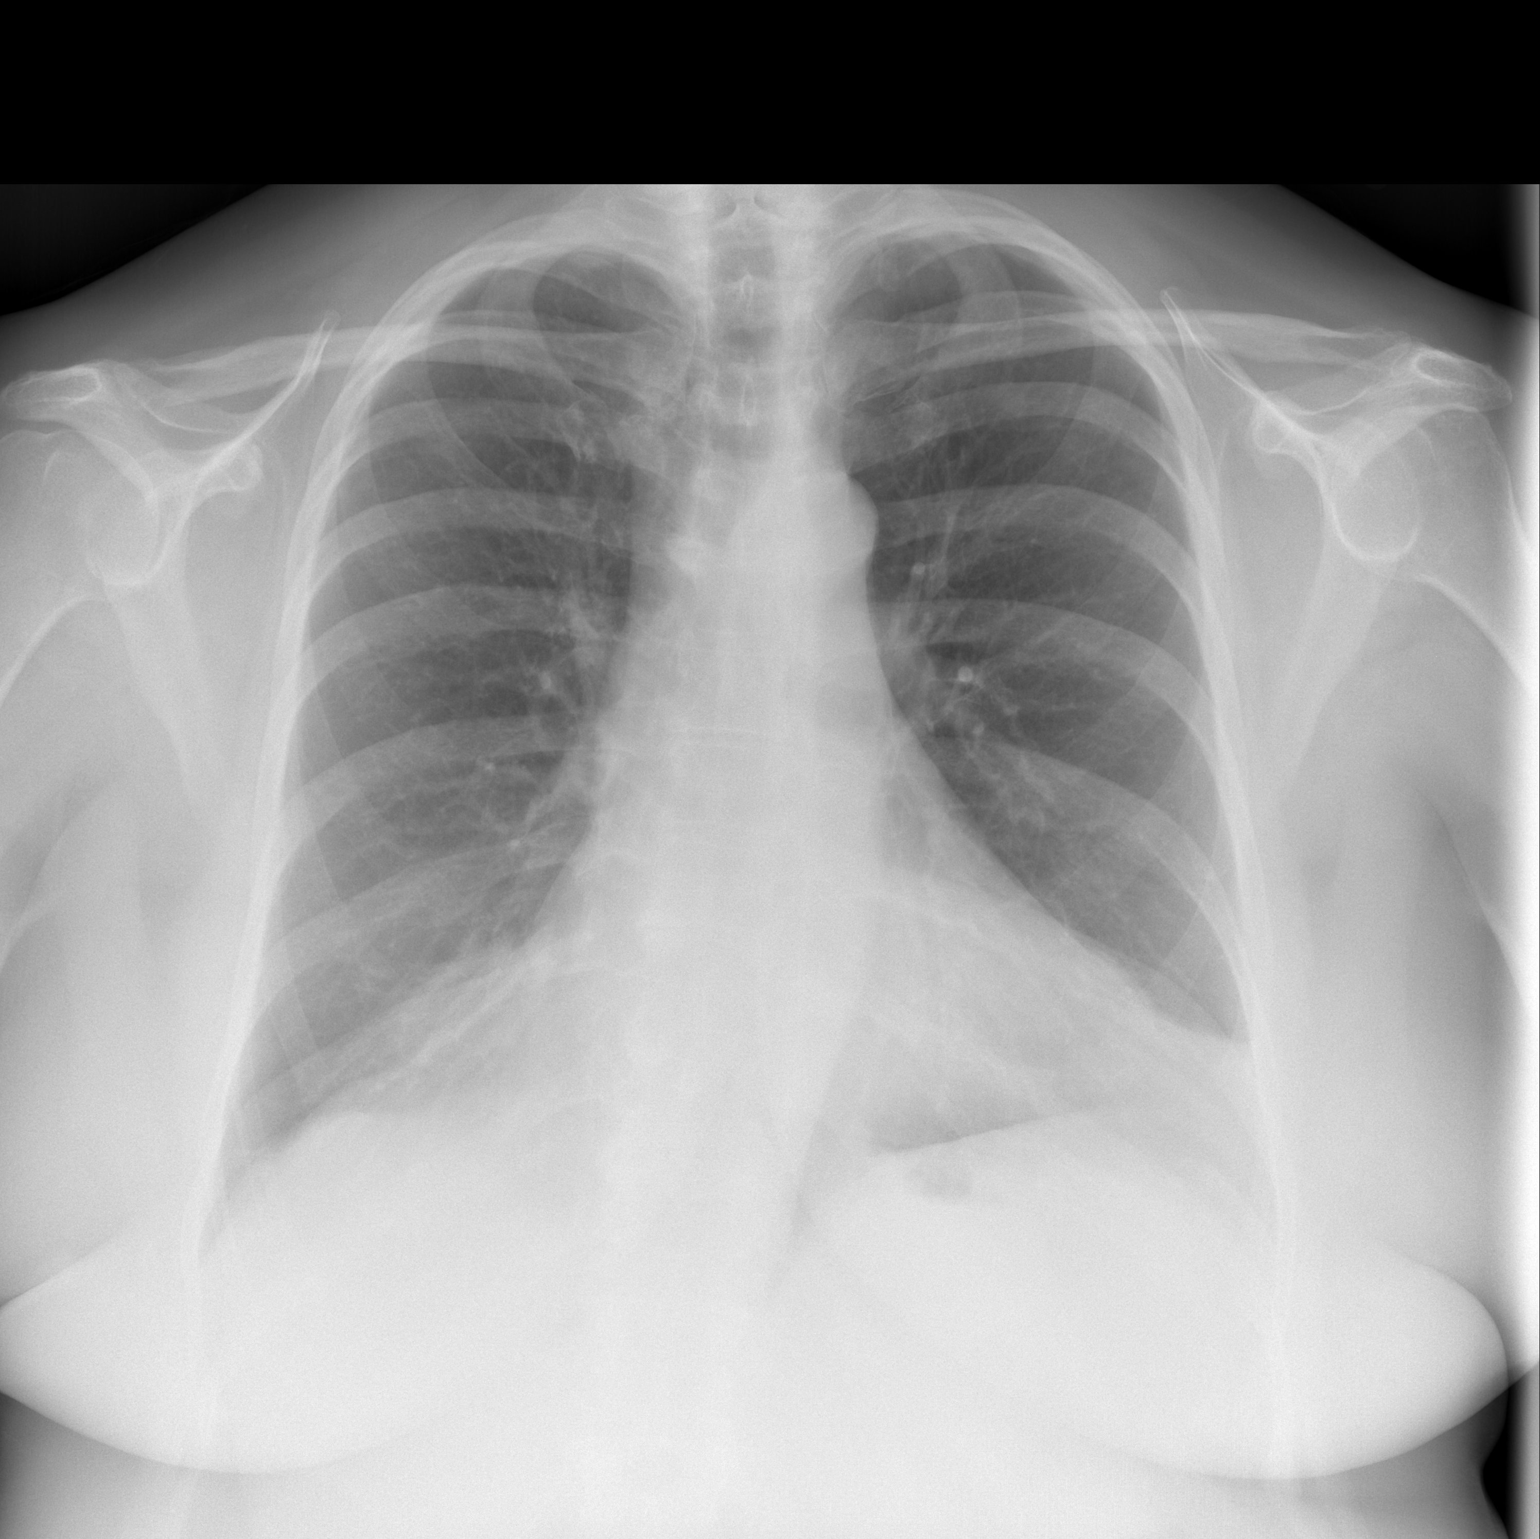

[w chest lat]
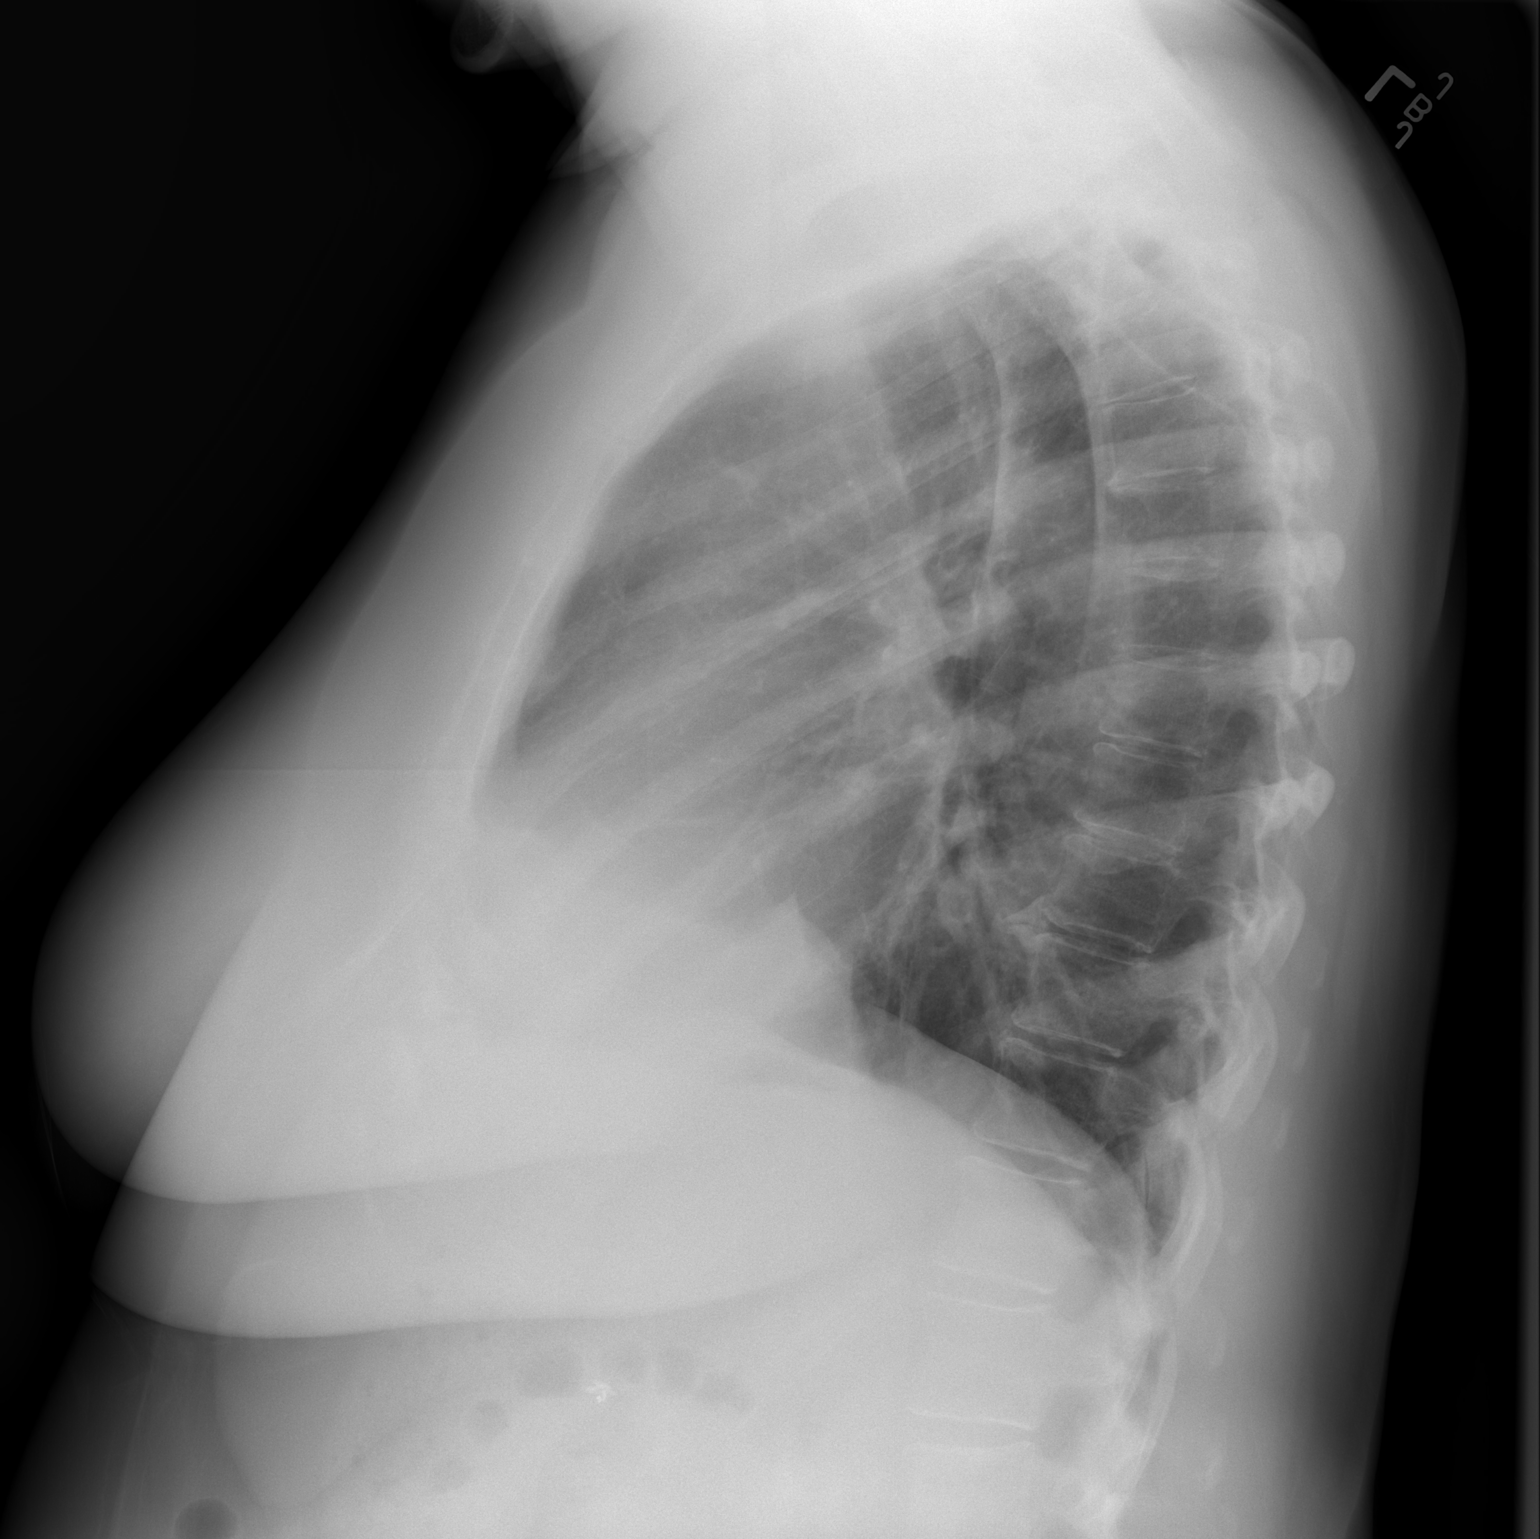

[2 of 2 positions shown; findings below may reference images not displayed]

FINDINGS: No active infiltrate or effusion is seen.  Mediastinal
contours appear normal.  The heart is within upper limits of normal
in size.  There are mild degenerative changes in the lower thoracic
spine.
IMPRESSION: No active lung disease.

## 2015-09-21 IMAGING — CR DG CHEST 2V
2 series · 2 of 2 positions shown · non-contrast
Comparison: Chest x-ray of 11/28/2012

CLINICAL DATA: Preop for left knee surgery, hypertension

EXAM:
CHEST  2 VIEW

[w chest pa]
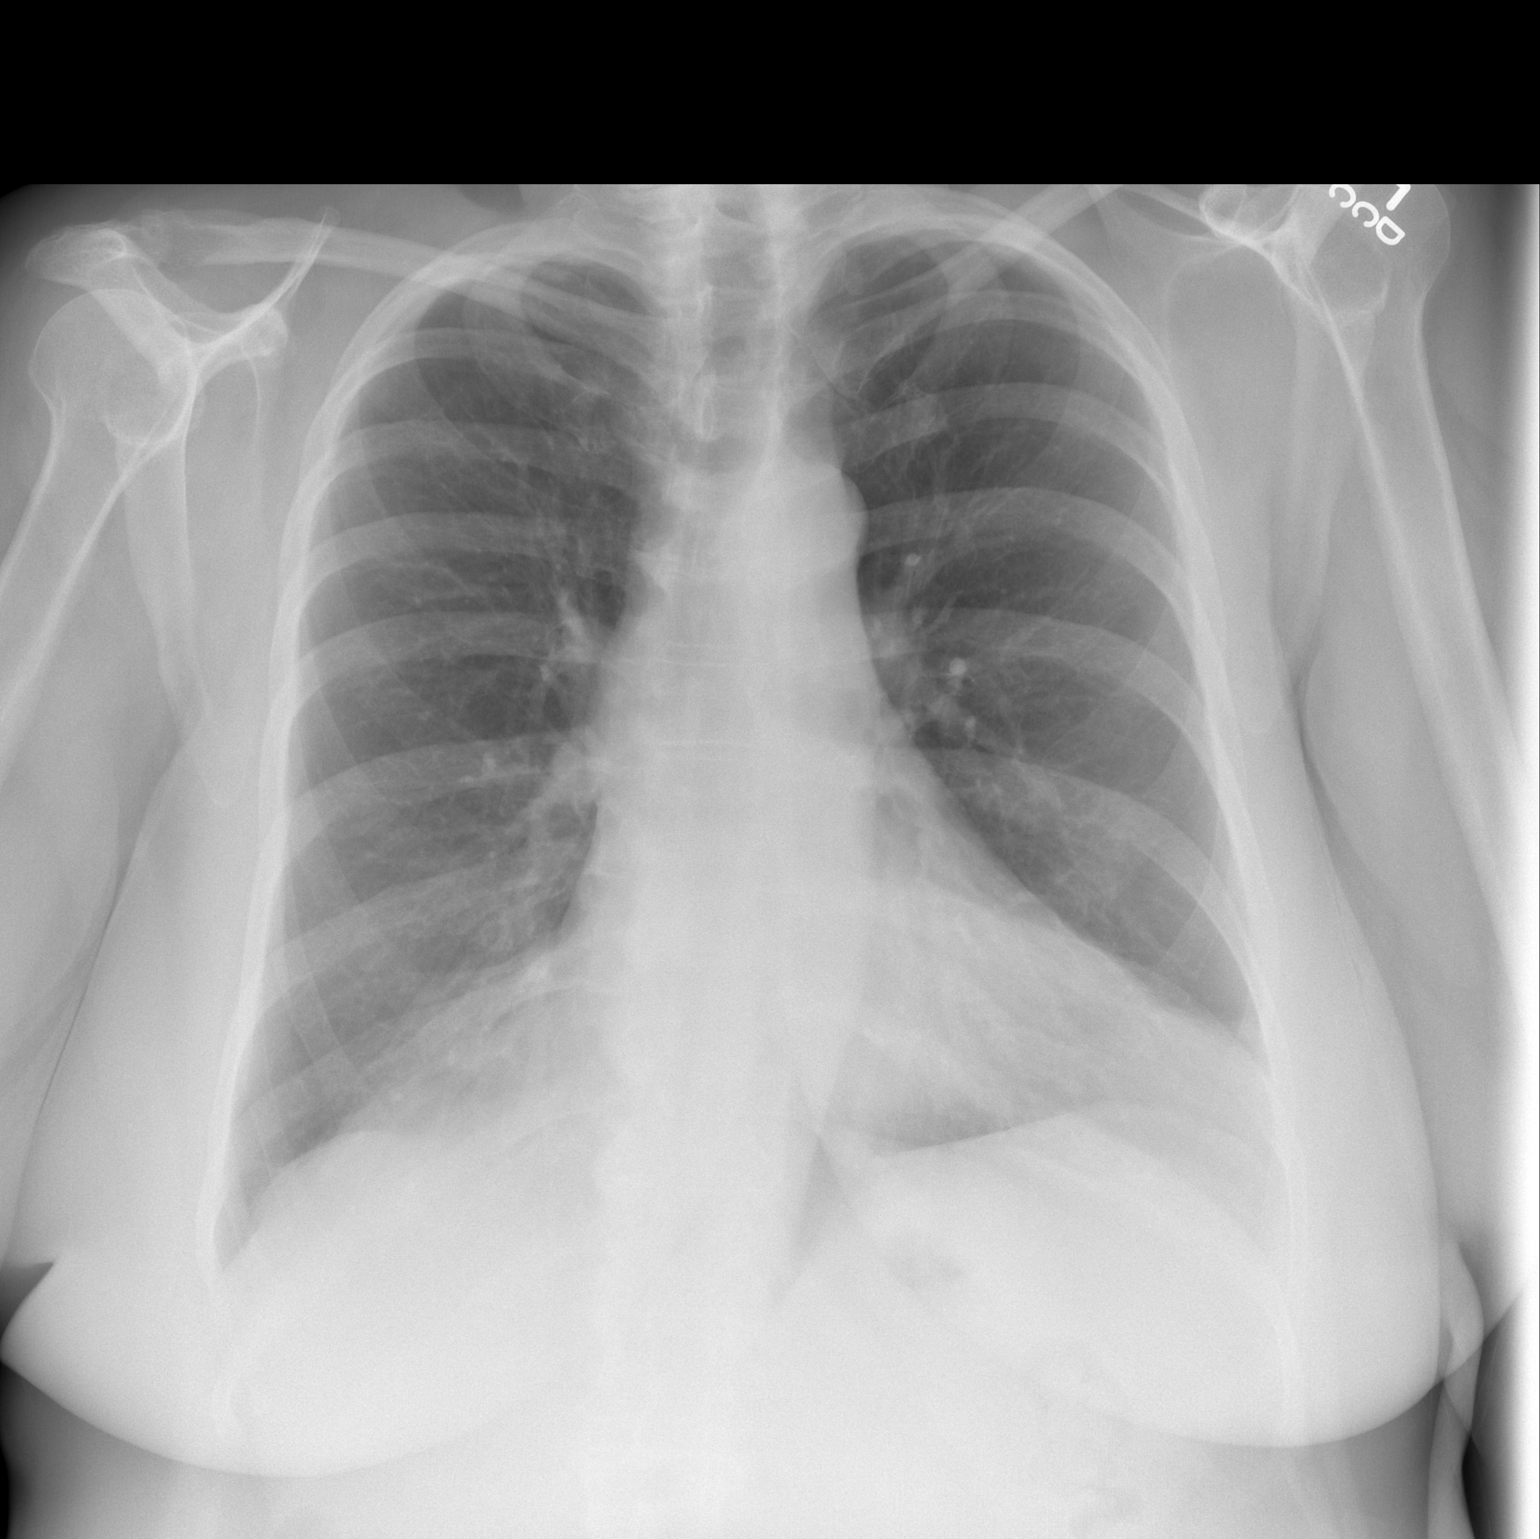

[w chest lat]
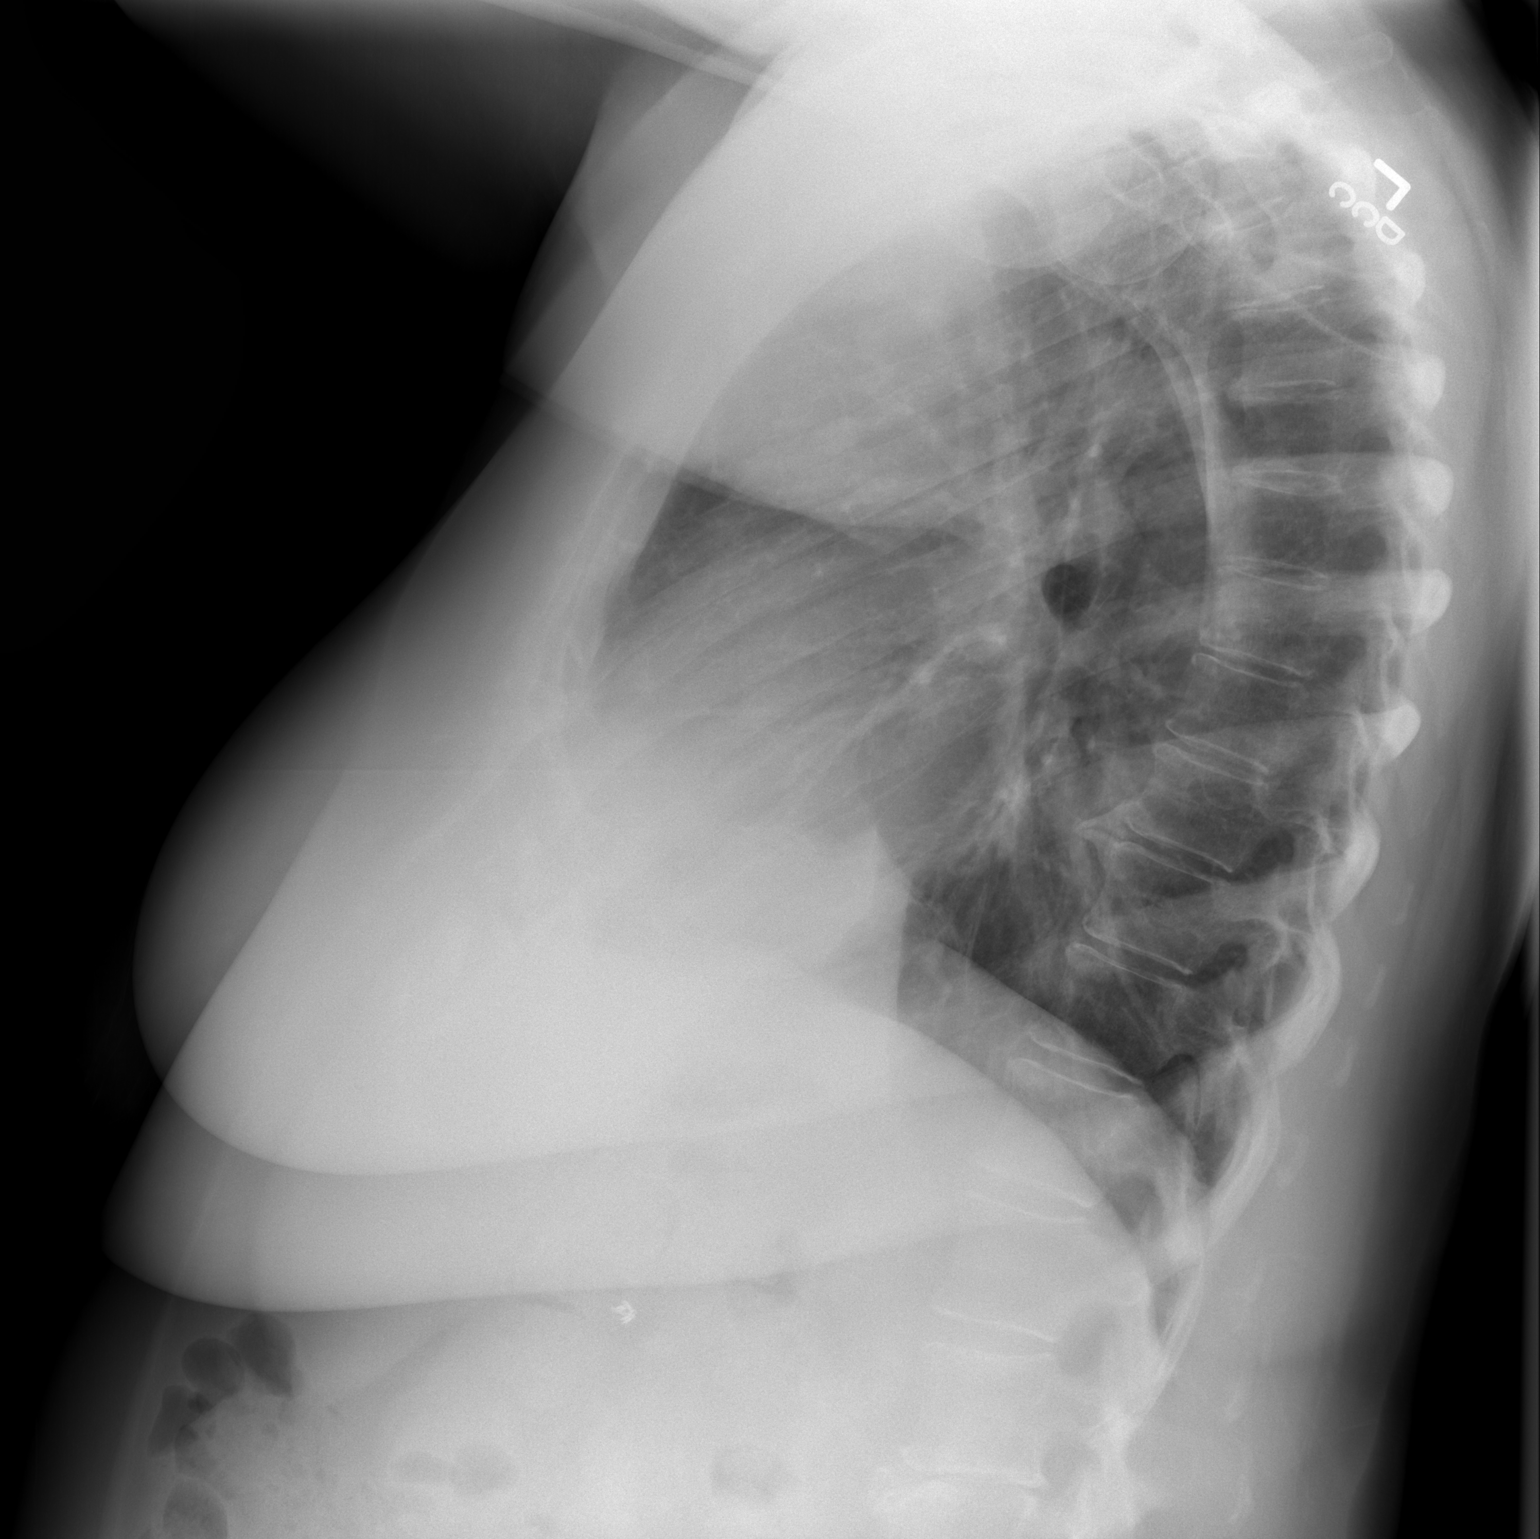

[2 of 2 positions shown; findings below may reference images not displayed]

FINDINGS: No active infiltrate or effusion is seen. Mediastinal contours
appear normal. The heart is within upper limits of normal. There are
degenerative changes present in the lower thoracic spine.
IMPRESSION: No active cardiopulmonary disease.

## 2017-02-23 ENCOUNTER — Telehealth (INDEPENDENT_AMBULATORY_CARE_PROVIDER_SITE_OTHER): Payer: Self-pay

## 2017-02-23 NOTE — Telephone Encounter (Signed)
Error
